# Patient Record
Sex: Female | Born: 1990 | Race: White | Hispanic: No | Marital: Single | Smoking: Never smoker
Health system: Southern US, Community
[De-identification: ages and names within clinical notes are randomized; demographics above are authoritative.]

## PROBLEM LIST (undated history)

## (undated) ENCOUNTER — Inpatient Hospital Stay (HOSPITAL_COMMUNITY): Payer: Self-pay

## (undated) DIAGNOSIS — L0291 Cutaneous abscess, unspecified: Secondary | ICD-10-CM

## (undated) DIAGNOSIS — D649 Anemia, unspecified: Secondary | ICD-10-CM

## (undated) DIAGNOSIS — T7840XA Allergy, unspecified, initial encounter: Secondary | ICD-10-CM

## (undated) DIAGNOSIS — F419 Anxiety disorder, unspecified: Secondary | ICD-10-CM

## (undated) DIAGNOSIS — I10 Essential (primary) hypertension: Secondary | ICD-10-CM

## (undated) DIAGNOSIS — F32A Depression, unspecified: Secondary | ICD-10-CM

## (undated) DIAGNOSIS — J45909 Unspecified asthma, uncomplicated: Secondary | ICD-10-CM

## (undated) DIAGNOSIS — F99 Mental disorder, not otherwise specified: Secondary | ICD-10-CM

## (undated) HISTORY — DX: Allergy, unspecified, initial encounter: T78.40XA

## (undated) HISTORY — DX: Depression, unspecified: F32.A

## (undated) HISTORY — DX: Anxiety disorder, unspecified: F41.9

## (undated) HISTORY — PX: CYST EXCISION: SHX5701

## (undated) HISTORY — DX: Essential (primary) hypertension: I10

## (undated) HISTORY — DX: Anemia, unspecified: D64.9

---

## 2010-08-24 ENCOUNTER — Emergency Department (HOSPITAL_COMMUNITY): Admission: EM | Admit: 2010-08-24 | Discharge: 2010-08-24 | Payer: Self-pay | Admitting: Emergency Medicine

## 2011-10-06 ENCOUNTER — Emergency Department (HOSPITAL_COMMUNITY): Payer: 59

## 2011-10-06 ENCOUNTER — Emergency Department (HOSPITAL_COMMUNITY)
Admission: EM | Admit: 2011-10-06 | Discharge: 2011-10-06 | Disposition: A | Discharge status: Home / Self Care | Payer: 59 | Attending: Emergency Medicine | Admitting: Emergency Medicine

## 2011-10-06 DIAGNOSIS — M545 Low back pain, unspecified: Secondary | ICD-10-CM | POA: Insufficient documentation

## 2011-10-06 DIAGNOSIS — M79609 Pain in unspecified limb: Secondary | ICD-10-CM | POA: Insufficient documentation

## 2011-10-06 DIAGNOSIS — S20219A Contusion of unspecified front wall of thorax, initial encounter: Secondary | ICD-10-CM | POA: Insufficient documentation

## 2011-10-06 DIAGNOSIS — R079 Chest pain, unspecified: Secondary | ICD-10-CM | POA: Insufficient documentation

## 2011-10-06 DIAGNOSIS — S335XXA Sprain of ligaments of lumbar spine, initial encounter: Secondary | ICD-10-CM | POA: Insufficient documentation

## 2012-03-30 ENCOUNTER — Emergency Department (HOSPITAL_COMMUNITY)
Admission: EM | Admit: 2012-03-30 | Discharge: 2012-03-30 | Disposition: A | Discharge status: Home / Self Care | Payer: Self-pay | Attending: Emergency Medicine | Admitting: Emergency Medicine

## 2012-03-30 ENCOUNTER — Encounter (HOSPITAL_COMMUNITY): Payer: Self-pay | Admitting: *Deleted

## 2012-03-30 ENCOUNTER — Emergency Department (HOSPITAL_COMMUNITY): Payer: Self-pay

## 2012-03-30 DIAGNOSIS — R079 Chest pain, unspecified: Secondary | ICD-10-CM | POA: Insufficient documentation

## 2012-03-30 DIAGNOSIS — R062 Wheezing: Secondary | ICD-10-CM | POA: Insufficient documentation

## 2012-03-30 DIAGNOSIS — J189 Pneumonia, unspecified organism: Secondary | ICD-10-CM | POA: Insufficient documentation

## 2012-03-30 MED ORDER — ALBUTEROL SULFATE HFA 108 (90 BASE) MCG/ACT IN AERS
2.0000 | INHALATION_SPRAY | RESPIRATORY_TRACT | Status: DC
  Administered 2012-03-30: 2 via RESPIRATORY_TRACT
  Filled 2012-03-30: qty 6.7

## 2012-03-30 MED ORDER — HYDROCOD POLST-CHLORPHEN POLST 10-8 MG/5ML PO LQCR: 5.0000 mL | Freq: Two times a day (BID) | ORAL | Status: DC

## 2012-03-30 MED ORDER — AZITHROMYCIN 250 MG PO TABS: 500.0000 mg | ORAL_TABLET | Freq: Once | ORAL | Status: AC

## 2012-03-30 NOTE — ED Notes (Signed)
Pt has returned from xray

## 2012-03-30 NOTE — ED Notes (Signed)
Patient transported to X-ray 

## 2012-03-30 NOTE — ED Notes (Signed)
Pt provided with Albuterol inhaler at discharge.

## 2012-03-30 NOTE — ED Notes (Signed)
Reports cough since Wednesday. Nonproductive. Fever 100-104. Is exposed to second hand smoke. States today she coughed really hard and felt something pop in left lower ribs. Now painful to take deep breath and to move . No crepitus appreciated. Tender to touch

## 2012-03-30 NOTE — Discharge Instructions (Signed)

## 2012-03-30 NOTE — ED Provider Notes (Signed)
History     CSN: [id number]  Arrival date & time 03/30/12  1552   First MD Initiated Contact with Patient 03/30/12 1726      Chief Complaint  Patient presents with  . Cough  . 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  Patient is a 20 y.o. female presenting with cough and chest pain. The history is provided by the patient. No language interpreter was used.  Cough This is a new problem. The current episode started more than 2 days ago. The problem occurs constantly. The problem has not changed since onset.The cough is productive of sputum. The maximum temperature recorded prior to her arrival was 101 to 101.9 F. The fever has been present for 3 to 4 days. Associated symptoms include chest pain and wheezing. Pertinent negatives include no chills, no sweats, no weight loss, no ear congestion, no ear pain, no headaches, no rhinorrhea, no sore throat, no myalgias, no shortness of breath and no eye redness. She has tried cough syrup and decongestants for the symptoms. The treatment provided no relief. She is not a smoker. Her past medical history does not include bronchitis, pneumonia, bronchiectasis, COPD, emphysema or asthma.  Chest Pain Primary symptoms include cough and wheezing. Pertinent negatives for primary symptoms include no shortness of breath.  The patient's medical history does not include asthma or COPD.     History reviewed. No pertinent past medical history.  History reviewed. No pertinent past  surgical history.  No family history on file.  History  Substance Use Topics  . Smoking status: Never Smoker   . Smokeless tobacco: Not on file  . Alcohol Use: Yes    OB History    Grav Para Term Preterm Abortions TAB SAB Ect Mult Living                  Review of Systems  Constitutional: Negative for chills and weight loss.  HENT: Negative for ear pain, sore throat and rhinorrhea.   Eyes: Negative for redness.  Respiratory: Positive for cough and wheezing. Negative for shortness of breath.   Cardiovascular: Positive for chest pain.  Musculoskeletal: Negative for myalgias.  Neurological: Negative for headaches.  All other systems reviewed and are negative.    Allergies  Tylenol  Home Medications   Current Outpatient Rx  Name Route Sig Dispense Refill  . CALCIUM PO Oral Take 1 tablet by mouth daily.    . GUAIFENESIN ER 600 MG PO TB12 Oral Take 1,200 mg by mouth 2 (two) times daily.    . IBUPROFEN 200 MG PO TABS Oral Take 400 mg by mouth every 6 (six) hours as needed. For fever      BP 106/71  Pulse 98  Temp(Src) 99.6 F (37.6 C) (Oral)  Resp 20  Ht 5\' 4"  (1.626 m)  Wt 166 lb (75.297 kg)  BMI 28.49 kg/m2  SpO2 97%  LMP 03/17/2012  Physical Exam  Nursing note and vitals reviewed. Constitutional: She is oriented to person, place, and time. She appears well-developed and well-nourished. No distress.  HENT:  Head: Normocephalic and atraumatic.  Right Ear: External ear normal.  Left Ear: External ear normal.  Nose: Nose normal.  Mouth/Throat: Oropharynx is clear and moist. No oropharyngeal exudate.  Eyes: Conjunctivae are normal. Pupils are equal, round, and reactive to light. No scleral icterus.  Neck: Normal range of motion. Neck supple.  Cardiovascular: Normal rate, regular rhythm and normal heart sounds.  Exam reveals no gallop and no friction rub.   No murmur heard. Pulmonary/Chest: Effort normal and breath sounds normal. No respiratory distress. She has  no wheezes. She has no rales. She exhibits tenderness.    Abdominal: Soft. Bowel sounds are normal. She exhibits no distension and no mass. There is no tenderness. There is no rebound and no guarding.  Musculoskeletal: Normal range of motion. She exhibits no edema and no tenderness.  Lymphadenopathy:    She has no cervical adenopathy.  Neurological: She is alert and oriented to person, place, and time. No cranial nerve deficit.  Skin: Skin is warm and dry. No rash noted. No erythema. No pallor.  Psychiatric: She has a normal mood and affect. Her behavior is normal. Judgment and thought content normal.    ED Course  Procedures (including critical care time)  Labs Reviewed - No data to display Dg Ribs Unilateral W/chest Left  03/30/2012  *RADIOLOGY REPORT*  Clinical Data: Left-sided chest pain and cough for 1 week.  LEFT RIBS AND CHEST - 3+ VIEW  Comparison: Chest radiograph 10/06/2011  Findings: The chest radiograph demonstrates new patchy densities at the right lung base.  These findings are concerning for airspace disease and pneumonia.  No evidence for a pneumothorax. Heart and mediastinum are within normal limits.  A BB was placed over the left lower ribs.  No evidence for a displaced left rib fracture.  IMPRESSION: Patchy densities at the right lung base are suggestive for pneumonia.  No evidence for a left rib fracture.  Original Report Authenticated By: Richarda Overlie, M.D.     CAP    MDM  Patient with CAP without hypoxia.  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        Izola Price [place], [place] 03/30/12 [phone]

## 2012-03-30 NOTE — ED Notes (Signed)
Patient complains of pain in her left side.  She has had a cough for 1 week.  She states her pain has increased today.  She has increased pain when coughing and breathing.

## 2012-03-31 NOTE — ED Provider Notes (Signed)
Medical screening examination/treatment/procedure(s) were performed by non-physician practitioner and as supervising physician I was immediately available for consultation/collaboration. Devoria Albe, MD, Armando Gang   Ward Givens, MD 03/31/12 Jacinta Shoe

## 2013-07-09 ENCOUNTER — Emergency Department (HOSPITAL_COMMUNITY)
Admission: EM | Admit: 2013-07-09 | Discharge: 2013-07-09 | Disposition: A | Discharge status: Home / Self Care | Payer: Self-pay | Attending: Emergency Medicine | Admitting: Emergency Medicine

## 2013-07-09 ENCOUNTER — Encounter (HOSPITAL_COMMUNITY): Payer: Self-pay | Admitting: *Deleted

## 2013-07-09 DIAGNOSIS — R42 Dizziness and giddiness: Secondary | ICD-10-CM | POA: Insufficient documentation

## 2013-07-09 DIAGNOSIS — L02419 Cutaneous abscess of limb, unspecified: Secondary | ICD-10-CM | POA: Insufficient documentation

## 2013-07-09 DIAGNOSIS — R110 Nausea: Secondary | ICD-10-CM | POA: Insufficient documentation

## 2013-07-09 DIAGNOSIS — L02415 Cutaneous abscess of right lower limb: Secondary | ICD-10-CM

## 2013-07-09 DIAGNOSIS — R509 Fever, unspecified: Secondary | ICD-10-CM | POA: Insufficient documentation

## 2013-07-09 DIAGNOSIS — R51 Headache: Secondary | ICD-10-CM | POA: Insufficient documentation

## 2013-07-09 DIAGNOSIS — R000 Tachycardia, unspecified: Secondary | ICD-10-CM | POA: Insufficient documentation

## 2013-07-09 MED ORDER — SULFAMETHOXAZOLE-TRIMETHOPRIM 800-160 MG PO TABS: 1.0000 | ORAL_TABLET | Freq: Two times a day (BID) | ORAL | Status: DC

## 2013-07-09 MED ORDER — LIDOCAINE HCL (PF) 2 % IJ SOLN
10.0000 mL | Freq: Once | INTRAMUSCULAR | Status: AC
  Administered 2013-07-09: 10 mL
  Filled 2013-07-09: qty 10

## 2013-07-09 MED ORDER — IBUPROFEN 600 MG PO TABS: 600.0000 mg | ORAL_TABLET | Freq: Four times a day (QID) | ORAL | Status: DC | PRN

## 2013-07-09 MED ORDER — SULFAMETHOXAZOLE-TMP DS 800-160 MG PO TABS
2.0000 | ORAL_TABLET | Freq: Once | ORAL | Status: AC
  Administered 2013-07-09: 2 via ORAL
  Filled 2013-07-09: qty 2

## 2013-07-09 MED ORDER — IBUPROFEN 800 MG PO TABS
800.0000 mg | ORAL_TABLET | Freq: Once | ORAL | Status: AC
  Administered 2013-07-09: 800 mg via ORAL
  Filled 2013-07-09: qty 1

## 2013-07-09 MED ORDER — ONDANSETRON 4 MG PO TBDP
4.0000 mg | ORAL_TABLET | Freq: Once | ORAL | Status: AC
  Administered 2013-07-09: 4 mg via ORAL
  Filled 2013-07-09: qty 1

## 2013-07-09 NOTE — ED Notes (Signed)
Pt has an abcess on the inner right thigh. Pt c/o nausea, body aches,  and lightheadedness. Pt also c/o headache.

## 2013-07-09 NOTE — ED Notes (Signed)
Pt c/o abscess to right inguinal area. States it is "hard and deep, about the size of a dollar coin". Reports if unsure if there is drainage from the site as there was a small wet spot in her pants but the abscess does not appear to have ruptured.

## 2013-07-09 NOTE — ED Provider Notes (Signed)
History    CSN: [id number] Arrival date & time 07/09/13  2040  First MD Initiated Contact with Patient 07/09/13 2226     Chief Complaint  Patient presents with  . Recurrent Skin Infections   (Consider location/radiation/quality/duration/timing/severity/associated sxs/prior Treatment) Patient is a 22 y.o. female presenting with abscess. The history is provided by the patient.  Abscess Location:  Leg Leg abscess location:  R upper leg Abscess quality: induration, painful and redness   Red streaking: no   Duration:  1 week Progression:  Worsening Pain details:    Quality:  Throbbing and shooting   Duration:  4 days   Timing:  Constant Relieved by:  Nothing Worsened by:  Nothing tried Ineffective treatments:  None tried Associated symptoms: fever, headaches (with fever) and nausea   Associated symptoms: no vomiting    Becky Fuller is a 22 y.o. female who presents to the ED with an abscess to the inner aspect of the right thigh. The area has been there for a week. It has continued to get worse and today the pain is really bad. Thinks the area may have started draining just as she arrived to the ED. She has had cyst drained in the past.   History reviewed. No pertinent past medical history. History reviewed. No pertinent past surgical history. History reviewed. No pertinent family history. History  Substance Use Topics  . Smoking status: Never Smoker   . Smokeless tobacco: Not on file  . Alcohol Use: Yes     Comment: occasionally   OB History   Grav Para Term Preterm Abortions TAB SAB Ect Mult Living                 Review of Systems  Constitutional: Positive for fever.  HENT: Negative for neck stiffness.   Gastrointestinal: Positive for nausea. Negative for vomiting and abdominal pain.  Musculoskeletal:       Pain and swelling inner aspect of right thigh.  Skin:       abscess  Neurological: Positive for light-headedness and headaches (with fever).    Psychiatric/Behavioral: The patient is not nervous/anxious.     Allergies  Tylenol  Home Medications   Current Outpatient Rx  Name  Route  Sig  Dispense  Refill  . desogestrel-ethinyl estradiol (CYCLESSA) 0.1/0.125/0.15 -0.025 MG tablet   Oral   Take 1 tablet by mouth daily.          BP 102/56  Pulse 110  Temp(Src) 102.5 F (39.2 C) (Oral)  Resp 20  Ht 5\' 4"  (1.626 m)  Wt 170 lb (77.111 kg)  BMI 29.17 kg/m2  SpO2 99%  LMP 06/21/2013 Physical Exam  Nursing note and vitals reviewed. Constitutional: She is oriented to person, place, and time. She appears well-developed and well-nourished. No distress.  HENT:  Head: Normocephalic.  Eyes: EOM are normal.  Neck: Normal range of motion. Neck supple.  Cardiovascular: Tachycardia present.   Pulmonary/Chest: Effort normal.  Musculoskeletal: Normal range of motion.       Right upper leg: She exhibits tenderness and swelling.       Legs: Abscess noted inner aspect of right thigh. Tender with exam. No drainage noted at this time.  Neurological: She is alert and oriented to person, place, and time. No cranial nerve deficit.  Skin: Skin is warm and dry.  Psychiatric: She has a normal mood and affect. Her behavior is normal.    ED Course  Procedures INCISION AND DRAINAGE Performed by: Trevel Dillenbeck Consent: Verbal  consent obtained. Risks and benefits: risks, benefits and alternatives were discussed Type: abscess  Body area: inner aspect of right thigh  Anesthesia: local infiltration  Local anesthetic: lidocaine 2% without epinephrine  Anesthetic total: 2 ml  Straight incision make with # 11 blade  Complexity: complex  Blunt dissection to break up loculations  Drainage: purulent  Drainage amount: small  Irrigated with NSS  Packing material: 1/4 in iodoform gauze  Patient tolerance: Patient tolerated the procedure well with no immediate complications.   MDM  22 y.o. female with abscess to right upper leg.  Fever 102.8 will treat fever and start antibiotics. Discussed with the patient findings and plan of care and all questioned fully answered. She will return if any problems arise.    Medication List    TAKE these medications       ibuprofen 600 MG tablet  Commonly known as:  ADVIL,MOTRIN  Take 1 tablet (600 mg total) by mouth every 6 (six) hours as needed for pain.     sulfamethoxazole-trimethoprim 800-160 MG per tablet  Commonly known as:  SEPTRA DS  Take 1 tablet by mouth every 12 (twelve) hours.      ASK your doctor about these medications       CYCLESSA 0.1/0.125/0.15 -0.025 MG tablet  Generic drug:  desogestrel-ethinyl estradiol  Take 1 tablet by mouth daily.         [place], [place] 07/09/13 2337

## 2013-07-09 NOTE — ED Notes (Signed)
Prior to departure, pt tolerating peanut butter crackers and fluids.

## 2013-07-14 NOTE — ED Provider Notes (Signed)
Medical screening examination/treatment/procedure(s) were performed by non-physician practitioner and as supervising physician I was immediately available for consultation/collaboration.  Raeford Razor, MD 07/14/13 2235

## 2013-08-16 IMAGING — CR DG RIBS W/ CHEST 3+V*L*
3 series · 3 of 3 positions shown · non-contrast
Comparison: Chest radiograph 10/06/2011

CLINICAL DATA: Left-sided chest pain and cough for 1 week.

LEFT RIBS AND CHEST - 3+ VIEW

[w chest pa]
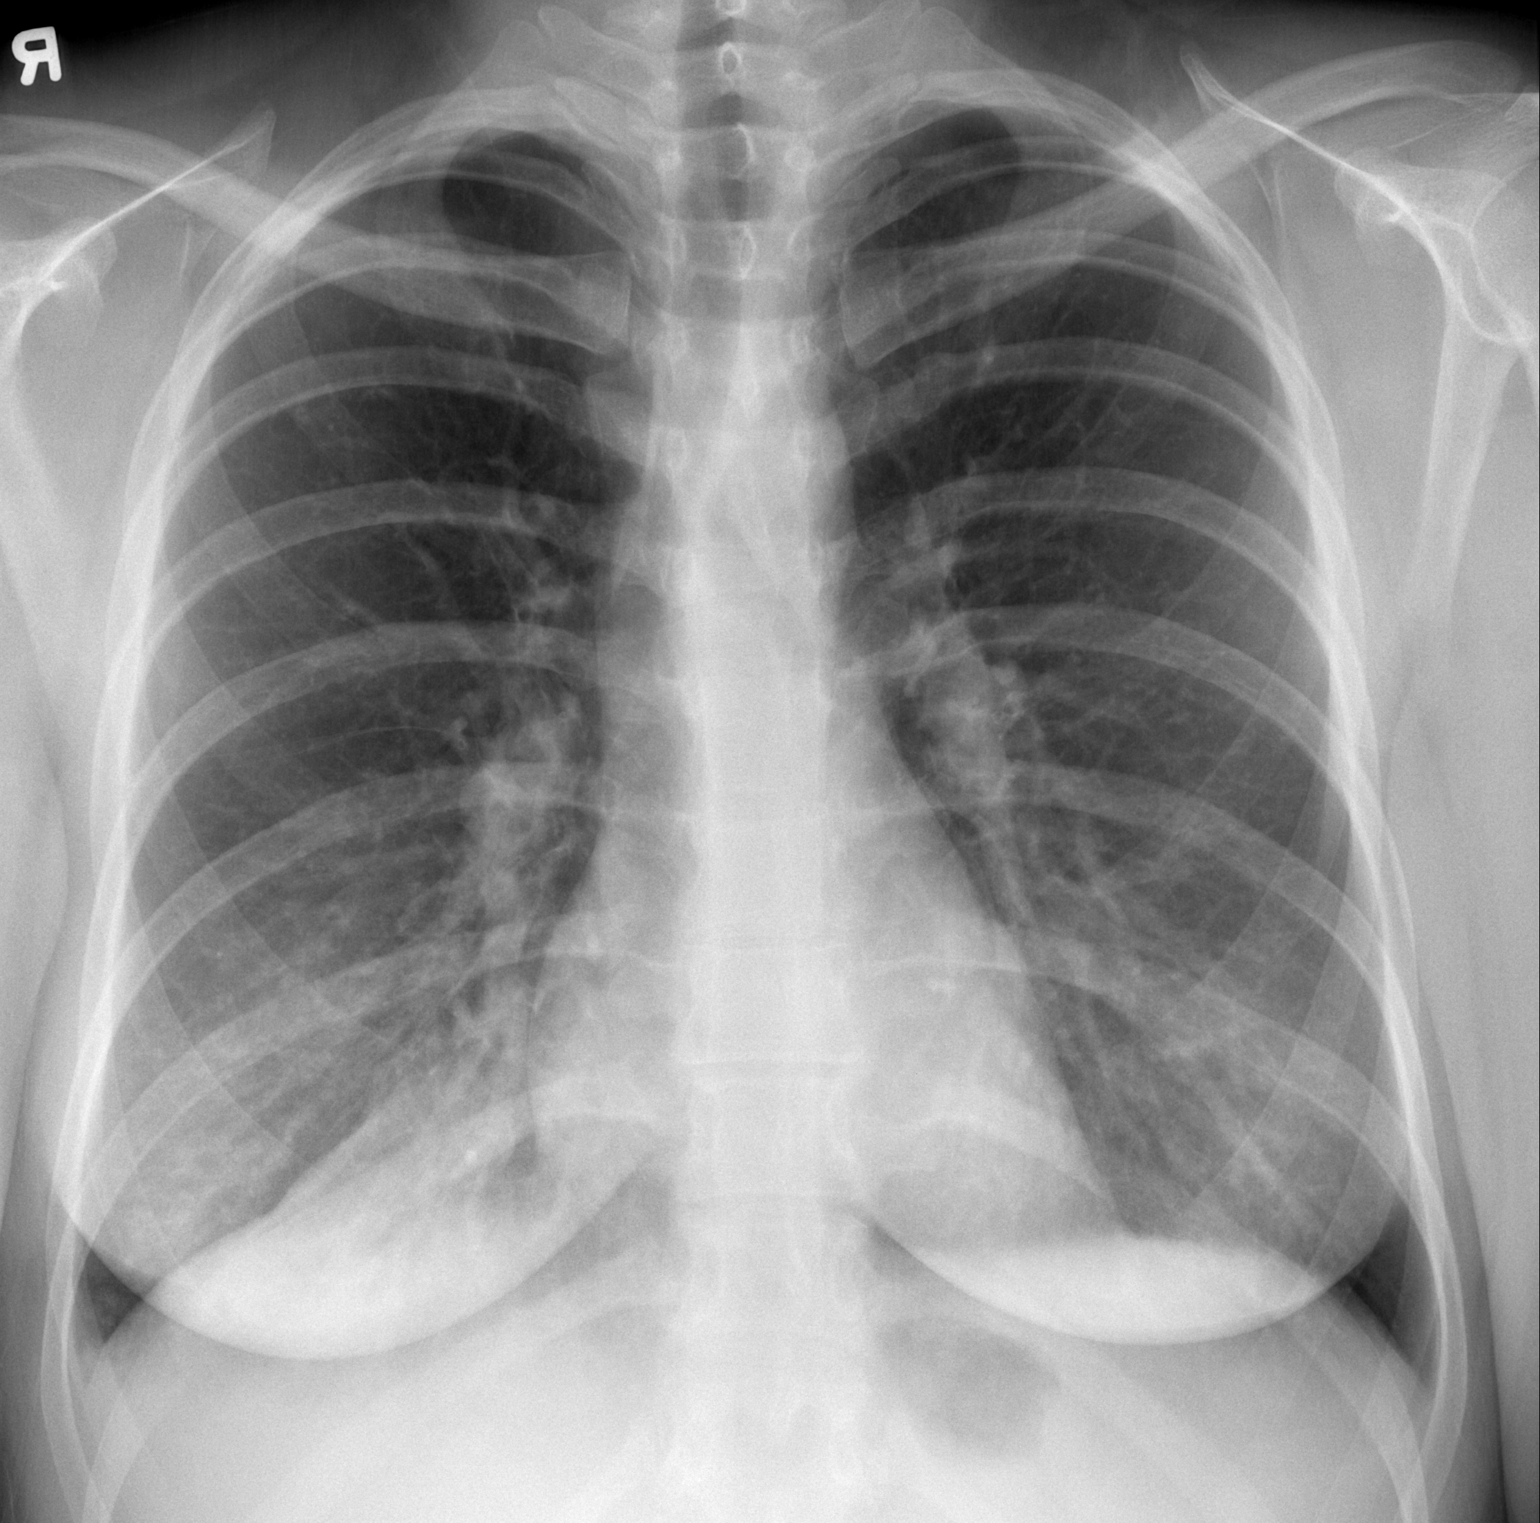

[w ribs ap/pa upper left]
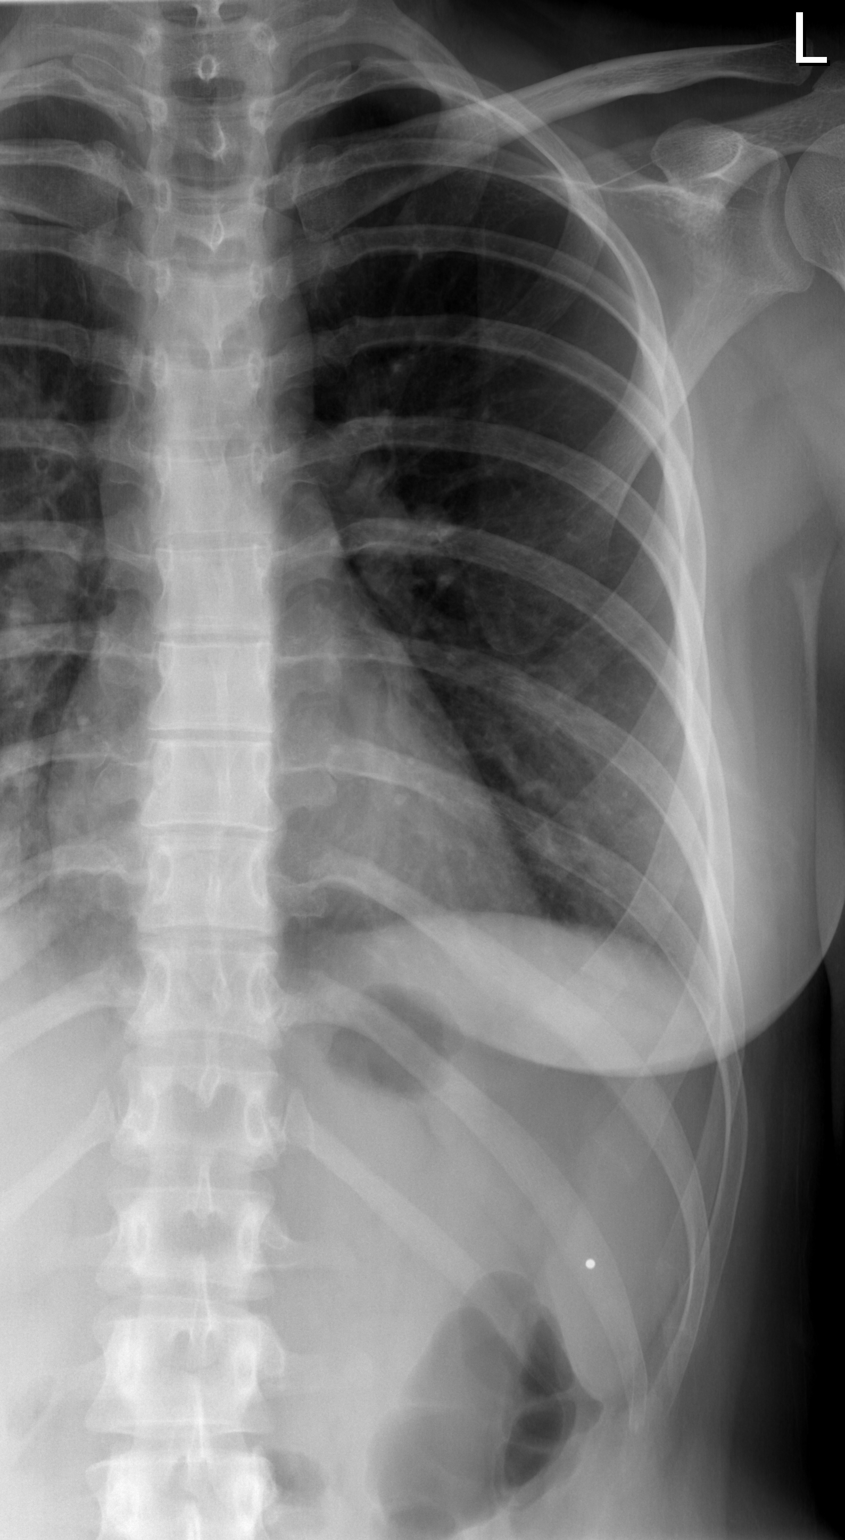

[w ribs oblique left]
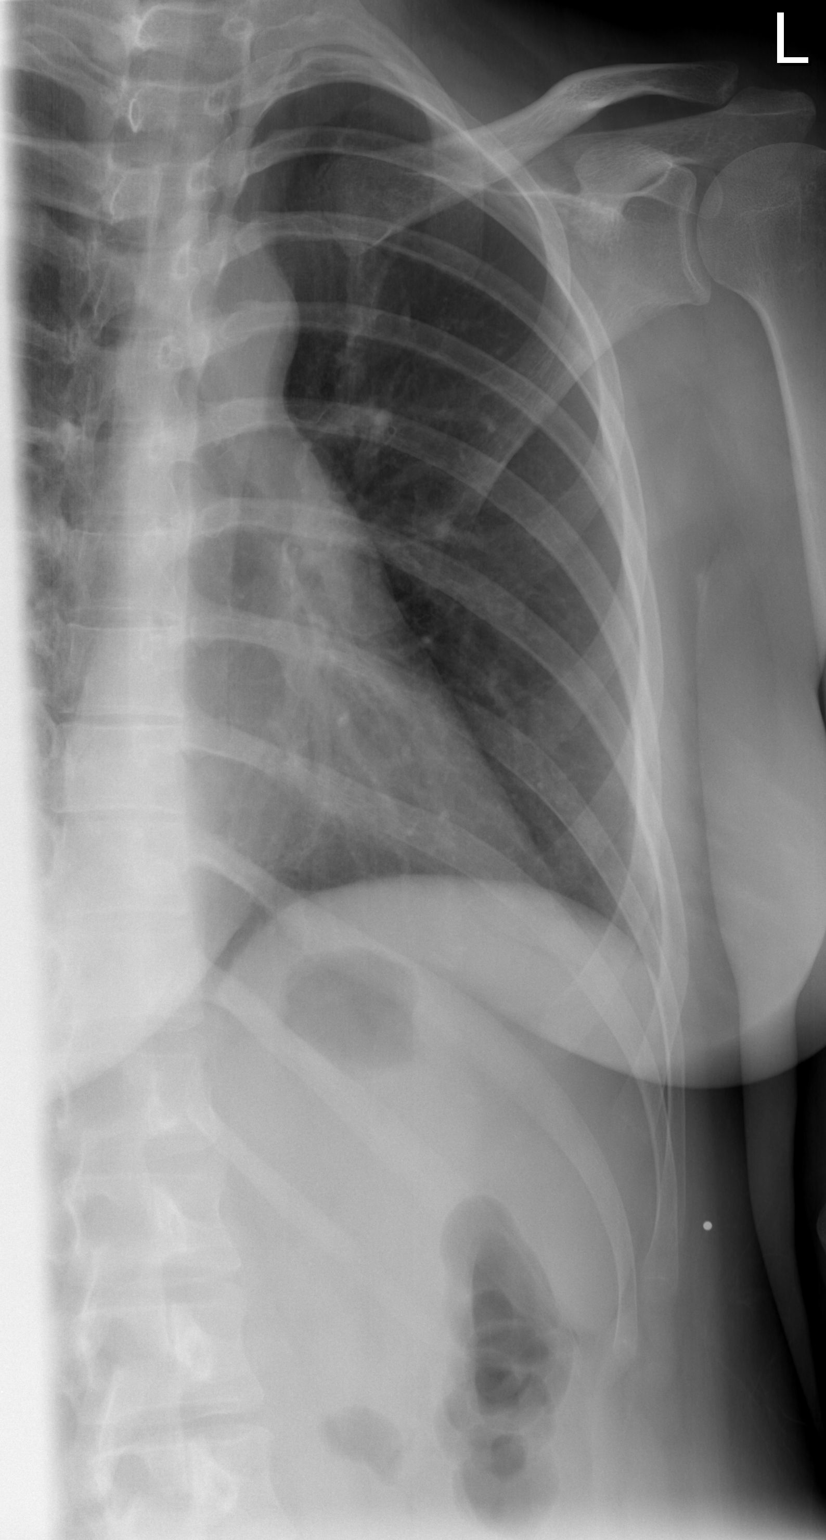

[3 of 3 positions shown; findings below may reference images not displayed]

FINDINGS: The chest radiograph demonstrates new patchy densities at
the right lung base.  These findings are concerning for airspace
disease and pneumonia.  No evidence for a pneumothorax. Heart and
mediastinum are within normal limits.  A BB was placed over the
left lower ribs.  No evidence for a displaced left rib fracture.
IMPRESSION: Patchy densities at the right lung base are suggestive for
pneumonia.

No evidence for a left rib fracture.

## 2015-09-22 ENCOUNTER — Encounter (HOSPITAL_COMMUNITY): Payer: Self-pay | Admitting: Emergency Medicine

## 2015-09-22 ENCOUNTER — Emergency Department (HOSPITAL_COMMUNITY)
Admission: EM | Admit: 2015-09-22 | Discharge: 2015-09-22 | Disposition: A | Discharge status: Home / Self Care | Payer: Self-pay | Attending: Emergency Medicine | Admitting: Emergency Medicine

## 2015-09-22 DIAGNOSIS — J45909 Unspecified asthma, uncomplicated: Secondary | ICD-10-CM | POA: Insufficient documentation

## 2015-09-22 DIAGNOSIS — Z79899 Other long term (current) drug therapy: Secondary | ICD-10-CM | POA: Insufficient documentation

## 2015-09-22 DIAGNOSIS — L02411 Cutaneous abscess of right axilla: Secondary | ICD-10-CM | POA: Insufficient documentation

## 2015-09-22 HISTORY — DX: Cutaneous abscess, unspecified: L02.91

## 2015-09-22 HISTORY — DX: Unspecified asthma, uncomplicated: J45.909

## 2015-09-22 MED ORDER — SULFAMETHOXAZOLE-TRIMETHOPRIM 800-160 MG PO TABS: 1.0000 | ORAL_TABLET | Freq: Two times a day (BID) | ORAL | Status: AC

## 2015-09-22 MED ORDER — LIDOCAINE-EPINEPHRINE (PF) 2 %-1:200000 IJ SOLN
10.0000 mL | Freq: Once | INTRAMUSCULAR | Status: AC
  Administered 2015-09-22: 10 mL via INTRADERMAL
  Filled 2015-09-22: qty 20

## 2015-09-22 NOTE — ED Provider Notes (Signed)
CSN: [id number]     Arrival date & time 09/22/15  1527 History   First MD Initiated Contact with Patient 09/22/15 1544     Chief Complaint  Patient presents with  . Abscess     (Consider location/radiation/quality/duration/timing/severity/associated sxs/prior Treatment) Patient is a 24 y.o. female presenting with abscess.  Abscess Associated symptoms: no fever and no headaches    Becky Fuller is a 24 y.o. female presents to emergency department complaining of an abscess to right axilla. Patient states started about a week ago, gradually, larger and more painful. She reports multiple prior abscesses. She denies any fever or chills. No generalized malaise. Pain worsened with palpation and arm movement. Denies any other complaints at this time. She does want to be tested for MRSA. She also states she is concerned she may have lymphoma so she gets recurrent abscesses. Explained to her this is unrelated. Patient went to [facility] clinic today and was sent here.  Past Medical History  Diagnosis Date  . Abscess   . Asthma    History reviewed. No pertinent past surgical history. No family history on file. Social History  Substance Use Topics  . Smoking status: Never Smoker   . Smokeless tobacco: None  . Alcohol Use: Yes     Comment: occasionally   OB History    No data available     Review of Systems  Constitutional: Negative for fever and chills.  Respiratory: Negative for cough, chest tightness and shortness of breath.   Cardiovascular: Negative for chest pain, palpitations and leg swelling.  Musculoskeletal: Negative for myalgias, arthralgias, neck pain and neck stiffness.  Skin: Positive for wound.  Neurological: Negative for dizziness, weakness and headaches.  All other systems reviewed and are negative.     Allergies  Tylenol  Home Medications   Prior to Admission medications   Medication Sig Start Date End Date Taking? Authorizing Provider   desogestrel-ethinyl estradiol (CYCLESSA) 0.1/0.125/0.15 -0.025 MG tablet Take 1 tablet by mouth daily.    Historical Provider, MD  ibuprofen (ADVIL,MOTRIN) 600 MG tablet Take 1 tablet (600 mg total) by mouth every 6 (six) hours as needed for pain. 07/09/13   Hope Orlene Och, NP  sulfamethoxazole-trimethoprim (SEPTRA DS) 800-160 MG per tablet Take 1 tablet by mouth every 12 (twelve) hours. 07/09/13   Hope Orlene Och, NP   BP 121/83 mmHg  Pulse 84  Temp(Src) 98.8 F (37.1 C) (Oral)  Resp 20  Ht  (1.626 m)  Wt 145 lb (65.772 kg)  BMI 24.88 kg/m2  SpO2 98%  LMP 09/09/2015 Physical Exam  Constitutional: She appears well-developed and well-nourished. No distress.  HENT:  Head: Normocephalic.  Eyes: Conjunctivae are normal.  Neck: Neck supple.  Cardiovascular: Normal rate, regular rhythm and normal heart sounds.   Pulmonary/Chest: Effort normal and breath sounds normal. No respiratory distress. She has no wheezes. She has no rales.  Musculoskeletal: She exhibits no edema.  Neurological: She is alert.  Skin: Skin is warm and dry.  3 x 3 cm area of induration, erythema, swelling, consistent with an abscess to the right axilla. No drainage. Mild surrounding cellulitis extending down the upper medial arm.  Psychiatric: She has a normal mood and affect. Her behavior is normal.  Nursing note and vitals reviewed.   ED Course  Procedures (including critical care time) Labs Review Labs Reviewed  WOUND CULTURE    Imaging Review No results found. I have personally reviewed and evaluated these images and lab results  as part of my medical decision-making.   EKG Interpretation None     INCISION AND DRAINAGE Performed by: Jaynie Crumble A Consent: Verbal consent obtained. Risks and benefits: risks, benefits and alternatives were discussed Type: abscess  Body area: right axilla  Anesthesia: local infiltration  Incision was made with a scalpel.  Local anesthetic: lidocaine 2% w  epinephrine  Anesthetic total: 3 ml  Complexity: complex Blunt dissection to break up loculations  Drainage: purulent  Drainage amount: large  Packing material: 1/4 in iodoform gauze  Patient tolerance: Patient tolerated the procedure well with no immediate complications.    MDM   Final diagnoses:  Abscess of right axilla   Patient with an abscess to the right axilla. Incised and drained. Will start on antibiotics given mild cellulitic changes around the abscess. Advised to do warm compresses at home. Follow-up as needed. Patient is to pull packing out in 2 days if improving, otherwise she is to return for recheck.  Filed Vitals:   09/22/15 1535 09/22/15 1538  BP: 121/83   Pulse: 84   Temp: 98.8 F (37.1 C)   TempSrc: Oral   Resp: 20   Height:   (1.626 m)  Weight:  145 lb (65.772 kg)  SpO2: 98%      Jaynie Crumble, PA-C 09/22/15 2145  Melene Plan, DO 09/23/15 [phone]

## 2015-09-22 NOTE — Discharge Instructions (Signed)
Apply warm compresses several times a day. Keep the wound clean and dry. Take antibiotics as prescribed until all gone. Follow-up as needed. Remove packing in 2 days.   Abscess Care After An abscess (also called a boil or furuncle) is an infected area that contains a collection of pus. Signs and symptoms of an abscess include pain, tenderness, redness, or hardness, or you may feel a moveable soft area under your skin. An abscess can occur anywhere in the body. The infection may spread to surrounding tissues causing cellulitis. A cut (incision) by the surgeon was made over your abscess and the pus was drained out. Gauze may have been packed into the space to provide a drain that will allow the cavity to heal from the inside outwards. The boil may be painful for 5 to 7 days. Most people with a boil do not have high fevers. Your abscess, if seen early, may not have localized, and may not have been lanced. If not, another appointment may be required for this if it does not get better on its own or with medications. HOME CARE INSTRUCTIONS   Only take over-the-counter or prescription medicines for pain, discomfort, or fever as directed by your caregiver.  When you bathe, soak and then remove gauze or iodoform packs at least daily or as directed by your caregiver. You may then wash the wound gently with mild soapy water. Repack with gauze or do as your caregiver directs. SEEK IMMEDIATE MEDICAL CARE IF:   You develop increased pain, swelling, redness, drainage, or bleeding in the wound site.  You develop signs of generalized infection including muscle aches, chills, fever, or a general ill feeling.  An oral temperature above 102 F (38.9 C) develops, not controlled by medication. See your caregiver for a recheck if you develop any of the symptoms described above. If medications (antibiotics) were prescribed, take them as directed. Document Released: 06/26/2005 Document Revised: 03/01/2012 Document  Reviewed: 02/21/2008 [facility] Patient Information 2015 [place], [place]. This information is not intended to replace advice given to you by your health care provider. Make sure you discuss any questions you have with your health care provider.

## 2015-09-22 NOTE — ED Notes (Signed)
Pt c/o abscess to right axilla area x 1 week.

## 2015-09-25 ENCOUNTER — Telehealth (HOSPITAL_COMMUNITY): Payer: Self-pay

## 2015-09-25 LAB — WOUND CULTURE: Special Requests: NORMAL

## 2015-09-25 NOTE — Telephone Encounter (Signed)
Lab called positive wound culture- MRSA. Treated per protocol. Attempting to contact pt.

## 2015-09-26 ENCOUNTER — Telehealth (HOSPITAL_BASED_OUTPATIENT_CLINIC_OR_DEPARTMENT_OTHER): Payer: Self-pay | Admitting: Emergency Medicine

## 2015-09-26 NOTE — Telephone Encounter (Signed)
Post ED Visit - Positive Culture Follow-up  Culture report reviewed by antimicrobial stewardship pharmacist:   Celedonio Miyamoto, Pharm.D., BCPS  Georgina Pillion, Pharm.D., BCPS  [place], [address].D., BCPS, AAHIVP  Estella Husk, Pharm.D., BCPS, AAHIVP  Cristy Reyes, [address].D.  Tennis Must, Pharm.D. Sandi Carne PharmD  Positive wound culture MRSA Treated with bactrim DS, organism sensitive to the same and no further patient follow-up is required at this time.  Berle Mull 09/26/2015, 1:50 PM

## 2015-09-27 ENCOUNTER — Telehealth (HOSPITAL_BASED_OUTPATIENT_CLINIC_OR_DEPARTMENT_OTHER): Payer: Self-pay | Admitting: Emergency Medicine

## 2015-10-27 ENCOUNTER — Telehealth (HOSPITAL_COMMUNITY): Payer: Self-pay

## 2015-10-27 NOTE — Telephone Encounter (Signed)
Unable to contact pt by mail or telephone. Unable to communicate lab results or treatment changes. 

## 2018-02-10 LAB — HM PAP SMEAR

## 2018-03-10 LAB — OB RESULTS CONSOLE GC/CHLAMYDIA
Chlamydia: NEGATIVE
GC PROBE AMP, GENITAL: NEGATIVE

## 2018-03-10 LAB — OB RESULTS CONSOLE HIV ANTIBODY (ROUTINE TESTING): HIV: NONREACTIVE

## 2018-03-10 LAB — OB RESULTS CONSOLE HEPATITIS B SURFACE ANTIGEN: Hepatitis B Surface Ag: NEGATIVE

## 2018-03-10 LAB — OB RESULTS CONSOLE RPR: RPR: NONREACTIVE

## 2018-03-10 LAB — OB RESULTS CONSOLE RUBELLA ANTIBODY, IGM: Rubella: IMMUNE

## 2018-03-24 ENCOUNTER — Encounter (HOSPITAL_COMMUNITY): Payer: Self-pay | Admitting: *Deleted

## 2018-03-24 ENCOUNTER — Other Ambulatory Visit: Payer: Self-pay

## 2018-03-24 ENCOUNTER — Inpatient Hospital Stay (HOSPITAL_COMMUNITY)
Admission: AD | Admit: 2018-03-24 | Discharge: 2018-03-24 | Disposition: A | Discharge status: Home / Self Care | Payer: BC Managed Care – PPO | Source: Ambulatory Visit | Attending: Obstetrics | Admitting: Obstetrics

## 2018-03-24 DIAGNOSIS — O1202 Gestational edema, second trimester: Secondary | ICD-10-CM | POA: Insufficient documentation

## 2018-03-24 DIAGNOSIS — Z3A16 16 weeks gestation of pregnancy: Secondary | ICD-10-CM | POA: Diagnosis not present

## 2018-03-24 LAB — URINALYSIS, ROUTINE W REFLEX MICROSCOPIC
Bilirubin Urine: NEGATIVE
Glucose, UA: NEGATIVE mg/dL
Hgb urine dipstick: NEGATIVE
KETONES UR: 20 mg/dL — AB
LEUKOCYTES UA: NEGATIVE
NITRITE: NEGATIVE
PH: 6 (ref 5.0–8.0)
PROTEIN: NEGATIVE mg/dL
Specific Gravity, Urine: 1.01 (ref 1.005–1.030)

## 2018-03-24 NOTE — MAU Provider Note (Signed)
History     CSN: [id number]  Arrival date and time: 03/24/18 1234   First Provider Initiated Contact with Patient 03/24/18 1326      Chief Complaint  Patient presents with  . Foot Swelling  . hands swollen   HPI   Ms Hubbard Hartshornshley D Backhaus is a 27 y.o. female G1P0 @ 4348w5d here in MAU with complaints of swelling in both feet that she noticed today. She called the office first and they told her to come in. The office was unable to see her. She noticed some blotching and increased swelling in both lower legs. 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 No pain, symptoms are bilateral.   OB History    Gravida  1   Para      Term      Preterm      AB      Living        SAB      TAB      Ectopic      Multiple      Live Births              Past Medical History:  Diagnosis Date  . Abscess   . Asthma     History reviewed. No pertinent surgical history.  No family history on file.  Social History   Tobacco Use  . Smoking status: Never Smoker  . Smokeless tobacco: Never Used  Substance Use Topics  . Alcohol use: Not Currently    Comment: occasionally  . Drug use: Not Currently    Allergies:  Allergies  Allergen Reactions  . Tylenol [Acetaminophen] Anaphylaxis    Medications Prior to Admission  Medication Sig Dispense Refill Last Dose  . loratadine (CLARITIN) 10 MG tablet Take 10 mg by mouth daily.     Marland Kitchen. desogestrel-ethinyl estradiol (CYCLESSA) 0.1/0.125/0.15 -0.025 MG tablet Take 1 tablet by mouth daily.   More than a month at Unknown time  . ibuprofen (ADVIL,MOTRIN) 600 MG tablet Take 1 tablet (600 mg total) by mouth every 6 (six) hours as needed for pain. 30 tablet 0 More than a month at Unknown time  . sulfamethoxazole-trimethoprim (SEPTRA DS) 800-160 MG per tablet Take 1 tablet by mouth every 12 (twelve) hours. 14 tablet 0    Results for orders placed or performed during the hospital encounter of  03/24/18 (from the past 48 hour(s))  Urinalysis, Routine w reflex microscopic     Status: Abnormal   Collection Time: 03/24/18 12:53 PM  Result Value Ref Range   Color, Urine YELLOW YELLOW   APPearance CLEAR CLEAR   Specific Gravity, Urine 1.010 1.005 - 1.030   pH 6.0 5.0 - 8.0   Glucose, UA NEGATIVE NEGATIVE mg/dL   Hgb urine dipstick NEGATIVE NEGATIVE   Bilirubin Urine NEGATIVE NEGATIVE   Ketones, ur 20 (A) NEGATIVE mg/dL   Protein, ur NEGATIVE NEGATIVE mg/dL   Nitrite NEGATIVE NEGATIVE   Leukocytes, UA NEGATIVE NEGATIVE    Comment: Performed at [facility], [address] [place], [place] [address]   Review of Systems  Constitutional: Negative for fever.  Musculoskeletal: Positive for joint swelling.   Physical Exam   Blood pressure 120/72, pulse 92, temperature 98.6 F (37 C), temperature source Oral, resp. rate 18, height 5\' 4"  (1.626 m), weight 183 lb 12 oz (83.3 kg), SpO2 100 %.  Physical Exam  Constitutional: She is oriented to person, place, and  time. She appears well-developed and well-nourished. No distress.  HENT:  Head: Normocephalic.  Eyes: Pupils are equal, round, and reactive to light.  Cardiovascular: Normal rate.  Respiratory: Effort normal and breath sounds normal.  Musculoskeletal: Normal range of motion. She exhibits edema (+1 bilateral lower extremity edema, equal and no skin changes. ). She exhibits no tenderness.  Neurological: She is alert and oriented to person, place, and time. GCS eye subscore is 4. GCS verbal subscore is 5. GCS motor subscore is 6.  Skin: Skin is warm. She is not diaphoretic.    MAU Course  Procedures  None  MDM  UA Discussed patient with Dr. Billy Coast, ok for DC home + fetal heart tones via doppler   Assessment and Plan   A:  1. Edema of lower extremity in second trimester, antepartum   2. [redacted] weeks gestation of pregnancy     P:  Discharge home in stable condition Discussed ted hoes Alternate elevation and  mobility Increase oral hydration Follow up in the office  Cynitha Berte, Harolyn Rutherford, NP 03/24/2018 7:22 PM

## 2018-03-24 NOTE — MAU Note (Signed)
Was at work,noticed her feet were getting really red; became kind of tingly.  Went to nurse at work.  Was told her feet and lower legs were swollen, might be related to BP elevation- but they did not check her BP.  Had a weird feeling in her lower abd when she woke up this morning, has not had it since.  Hands are also a little swollen.

## 2018-03-24 NOTE — Discharge Instructions (Signed)

## 2018-07-30 LAB — OB RESULTS CONSOLE GBS: GBS: NEGATIVE

## 2018-08-11 ENCOUNTER — Encounter (HOSPITAL_COMMUNITY): Payer: Self-pay | Admitting: *Deleted

## 2018-08-11 ENCOUNTER — Inpatient Hospital Stay (HOSPITAL_COMMUNITY)
Admission: AD | Admit: 2018-08-11 | Discharge: 2018-08-11 | Disposition: A | Discharge status: Home / Self Care | Payer: BC Managed Care – PPO | Source: Ambulatory Visit | Attending: Obstetrics & Gynecology | Admitting: Obstetrics & Gynecology

## 2018-08-11 DIAGNOSIS — R030 Elevated blood-pressure reading, without diagnosis of hypertension: Secondary | ICD-10-CM | POA: Diagnosis present

## 2018-08-11 DIAGNOSIS — Z3A36 36 weeks gestation of pregnancy: Secondary | ICD-10-CM

## 2018-08-11 DIAGNOSIS — O133 Gestational [pregnancy-induced] hypertension without significant proteinuria, third trimester: Secondary | ICD-10-CM

## 2018-08-11 LAB — URINALYSIS, ROUTINE W REFLEX MICROSCOPIC
Bacteria, UA: NONE SEEN
Bilirubin Urine: NEGATIVE
GLUCOSE, UA: NEGATIVE mg/dL
HGB URINE DIPSTICK: NEGATIVE
Ketones, ur: 20 mg/dL — AB
NITRITE: NEGATIVE
Protein, ur: NEGATIVE mg/dL
SPECIFIC GRAVITY, URINE: 1.016 (ref 1.005–1.030)
pH: 6 (ref 5.0–8.0)

## 2018-08-11 LAB — CBC
HCT: 37.6 % (ref 36.0–46.0)
Hemoglobin: 13 g/dL (ref 12.0–15.0)
MCH: 31.6 pg (ref 26.0–34.0)
MCHC: 34.6 g/dL (ref 30.0–36.0)
MCV: 91.5 fL (ref 78.0–100.0)
PLATELETS: 187 10*3/uL (ref 150–400)
RBC: 4.11 MIL/uL (ref 3.87–5.11)
RDW: 13.5 % (ref 11.5–15.5)
WBC: 14.7 10*3/uL — AB (ref 4.0–10.5)

## 2018-08-11 LAB — COMPREHENSIVE METABOLIC PANEL
ALT: 14 U/L (ref 0–44)
AST: 17 U/L (ref 15–41)
Albumin: 3.4 g/dL — ABNORMAL LOW (ref 3.5–5.0)
Alkaline Phosphatase: 110 U/L (ref 38–126)
Anion gap: 12 (ref 5–15)
BUN: 6 mg/dL (ref 6–20)
CHLORIDE: 103 mmol/L (ref 98–111)
CO2: 18 mmol/L — AB (ref 22–32)
Calcium: 9.3 mg/dL (ref 8.9–10.3)
Creatinine, Ser: 0.63 mg/dL (ref 0.44–1.00)
GFR calc Af Amer: >60 mL/min (ref >60)
Glucose, Bld: 111 mg/dL — ABNORMAL HIGH (ref 70–99)
POTASSIUM: 4.1 mmol/L (ref 3.5–5.1)
SODIUM: 133 mmol/L — AB (ref 135–145)
Total Bilirubin: 0.7 mg/dL (ref 0.3–1.2)
Total Protein: 7.7 g/dL (ref 6.5–8.1)

## 2018-08-11 LAB — PROTEIN / CREATININE RATIO, URINE
CREATININE, URINE: 114 mg/dL
Protein Creatinine Ratio: 0.11 mg/mg{Cre} (ref 0.00–0.15)
TOTAL PROTEIN, URINE: 13 mg/dL

## 2018-08-11 MED ORDER — LABETALOL HCL 200 MG PO TABS: 200.0000 mg | ORAL_TABLET | Freq: Three times a day (TID) | ORAL | 0 refills | Status: DC

## 2018-08-11 NOTE — MAU Provider Note (Addendum)
History     CSN: [id number]  Arrival date and time: 08/11/18 1053   First Provider Initiated Contact with Patient 08/11/18 1141      Chief Complaint  Patient presents with  . Hypertension   HPI Becky Fuller is a 27 y.o. G1P0 at 3139w5d who presents from the office for evaluation of elevated blood pressures. She went for her routine OB appointment and her BP was found to be elevated. She denies any headache, visual changes or epigastric pain. She denies any pain, vaginal bleeding or discharge. Reports good fetal movement.   OB History    Gravida  1   Para      Term      Preterm      AB      Living        SAB      TAB      Ectopic      Multiple      Live Births              Past Medical History:  Diagnosis Date  . Abscess   . Asthma    childhood    Past Surgical History:  Procedure Laterality Date  . CYST EXCISION      History reviewed. No pertinent family history.  Social History   Tobacco Use  . Smoking status: Never Smoker  . Smokeless tobacco: Never Used  Substance Use Topics  . Alcohol use: Not Currently    Comment: not in pregnancy  . Drug use: Never    Allergies:  Allergies  Allergen Reactions  . Tylenol [Acetaminophen] Anaphylaxis    Medications Prior to Admission  Medication Sig Dispense Refill Last Dose  . loratadine (CLARITIN) 10 MG tablet Take 10 mg by mouth daily.       Review of Systems  Constitutional: Negative.  Negative for fatigue and fever.  HENT: Negative.   Respiratory: Negative.  Negative for shortness of breath.   Cardiovascular: Negative.  Negative for chest pain.  Gastrointestinal: Negative.  Negative for abdominal pain, constipation, diarrhea, nausea and vomiting.  Genitourinary: Negative.  Negative for dysuria.  Neurological: Negative.  Negative for dizziness and headaches.   Physical Exam   Blood pressure (!) 137/93, pulse (!) 104, temperature 98.2 F (36.8 C), temperature source Oral, resp. rate  17, height 5\' 4"  (1.626 m), weight 99.8 kg, SpO2 100 %.  Patient Vitals for the past 24 hrs:  BP Temp Temp src Pulse Resp SpO2 Height Weight  08/11/18 1245 (!) 136/108 - - (!) 109 - - - -  08/11/18 1230 (!) 137/96 - - (!) 105 - - - -  08/11/18 1215 (!) 142/102 - - (!) 110 - - - -  08/11/18 1200 (!) 138/100 - - (!) 114 - - - -  08/11/18 1145 (!) 142/97 - - (!) 106 - - - -  08/11/18 1130 (!) 137/93 - - (!) 104 - - - -  08/11/18 1100 (!) 139/103 98.2 F (36.8 C) Oral (!) 113 17 100 % 5\' 4"  (1.626 m) 99.8 kg   Physical Exam  Nursing note and vitals reviewed. Constitutional: She is oriented to person, place, and time. She appears well-developed and well-nourished. No distress.  HENT:  Head: Normocephalic.  Eyes: Pupils are equal, round, and reactive to light.  Cardiovascular: Normal rate, regular rhythm and normal heart sounds.  Respiratory: Effort normal and breath sounds normal. No respiratory distress.  GI: Soft. Bowel sounds are normal. She exhibits no distension.  There is no tenderness.  Neurological: She is alert and oriented to person, place, and time.  Skin: Skin is warm and dry.  Psychiatric: She has a normal mood and affect. Her behavior is normal. Judgment and thought content normal.   Fetal Tracing:  Baseline: 125 Variability: moderate Accels: 15x15 Decels: none  Toco: occasional uc's  MAU Course  Procedures Results for orders placed or performed during the hospital encounter of 08/11/18 (from the past 24 hour(s))  Protein / creatinine ratio, urine     Status: None   Collection Time: 08/11/18 11:01 AM  Result Value Ref Range   Creatinine, Urine 114.00 mg/dL   Total Protein, Urine 13 mg/dL   Protein Creatinine Ratio 0.11 0.00 - 0.15 mg/mg[Cre]  CBC     Status: Abnormal   Collection Time: 08/11/18 11:14 AM  Result Value Ref Range   WBC 14.7 (H) 4.0 - 10.5 K/uL   RBC 4.11 3.87 - 5.11 MIL/uL   Hemoglobin 13.0 12.0 - 15.0 g/dL   HCT 16.137.6 09.636.0 - 04.546.0 %   MCV 91.5  78.0 - 100.0 fL   MCH 31.6 26.0 - 34.0 pg   MCHC 34.6 30.0 - 36.0 g/dL   RDW 40.913.5 81.111.5 - 91.415.5 %   Platelets 187 150 - 400 K/uL  Comprehensive metabolic panel     Status: Abnormal   Collection Time: 08/11/18 11:14 AM  Result Value Ref Range   Sodium 133 (L) 135 - 145 mmol/L   Potassium 4.1 3.5 - 5.1 mmol/L   Chloride 103 98 - 111 mmol/L   CO2 18 (L) 22 - 32 mmol/L   Glucose, Bld 111 (H) 70 - 99 mg/dL   BUN 6 6 - 20 mg/dL   Creatinine, Ser 7.820.63 0.44 - 1.00 mg/dL   Calcium 9.3 8.9 - 95.610.3 mg/dL   Total Protein 7.7 6.5 - 8.1 g/dL   Albumin 3.4 (L) 3.5 - 5.0 g/dL   AST 17 15 - 41 U/L   ALT 14 0 - 44 U/L   Alkaline Phosphatase 110 38 - 126 U/L   Total Bilirubin 0.7 0.3 - 1.2 mg/dL   GFR calc non Af Amer >60 >60 mL/min   GFR calc Af Amer >60 >60 mL/min   Anion gap 12 5 - 15  Urinalysis, Routine w reflex microscopic     Status: Abnormal   Collection Time: 08/11/18 11:26 AM  Result Value Ref Range   Color, Urine YELLOW YELLOW   APPearance HAZY (A) CLEAR   Specific Gravity, Urine 1.016 1.005 - 1.030   pH 6.0 5.0 - 8.0   Glucose, UA NEGATIVE NEGATIVE mg/dL   Hgb urine dipstick NEGATIVE NEGATIVE   Bilirubin Urine NEGATIVE NEGATIVE   Ketones, ur 20 (A) NEGATIVE mg/dL   Protein, ur NEGATIVE NEGATIVE mg/dL   Nitrite NEGATIVE NEGATIVE   Leukocytes, UA LARGE (A) NEGATIVE   RBC / HPF 0-5 0 - 5 RBC/hpf   WBC, UA 0-5 0 - 5 WBC/hpf   Bacteria, UA NONE SEEN NONE SEEN   Squamous Epithelial / LPF 6-10 0 - 5   Mucus PRESENT    MDM Prenatal records from private office reviewed. Labs ordered and reviewed.  UA, UC CBC, CMP, PCR  Consulted with Dr. Ernestina PennaFogleman- ok to discharge patient home and start labetalol 200mg  TID. Will have patient follow up in the office on Monday  Assessment and Plan   1. Gestational hypertension, third trimester   2. [redacted] weeks gestation of pregnancy    -Discharge home  in stable condition -Rx for labetalol sent to patient's pharmacy -Preeclampsia precautions  discussed -Patient advised to follow-up with [facility] on Monday for ROB and NST -Patient may return to MAU as needed or if her condition were to change or worsen  Rolm Bookbinder CNM 08/11/2018, 11:42 AM   Report received from MAU provider with no exam findings c/w PEC. Report also noted non-severe range bps however on review of this note and pt's chart, severe range dbp seen prior to d/c. Pt at this time has already been seen back in office for f/u and appropriate plan has been made.   Lendon Colonel 08/14/2018 2:20 PM

## 2018-08-11 NOTE — Discharge Instructions (Signed)
Hypertension During Pregnancy °Hypertension, commonly called high blood pressure, is when the force of blood pumping through your arteries is too strong. Arteries are blood vessels that carry blood from the heart throughout the body. Hypertension during pregnancy can cause problems for you and your baby. Your baby may be born early (prematurely) or may not weigh as much as he or she should at birth. Very bad cases of hypertension during pregnancy can be life-threatening. °Different types of hypertension can occur during pregnancy. These include: °· Chronic hypertension. This happens when: °? You have hypertension before pregnancy and it continues during pregnancy. °? You develop hypertension before you are [redacted] weeks pregnant, and it continues during pregnancy. °· Gestational hypertension. This is hypertension that develops after the 20th week of pregnancy. °· Preeclampsia, also called toxemia of pregnancy. This is a very serious type of hypertension that develops only during pregnancy. It affects the whole body, and it can be very dangerous for you and your baby. ° °Gestational hypertension and preeclampsia usually go away within 6 weeks after your baby is born. Women who have hypertension during pregnancy have a greater chance of developing hypertension later in life or during future pregnancies. °What are the causes? °The exact cause of hypertension is not known. °What increases the risk? °There are certain factors that make it more likely for you to develop hypertension during pregnancy. These include: °· Having hypertension during a previous pregnancy or prior to pregnancy. °· Being overweight. °· Being older than age 40. °· Being pregnant for the first time or being pregnant with more than one baby. °· Becoming pregnant using fertilization methods such as IVF (in vitro fertilization). °· Having diabetes, kidney problems, or systemic lupus erythematosus. °· Having a family history of hypertension. ° °What are the  signs or symptoms? °Chronic hypertension and gestational hypertension rarely cause symptoms. Preeclampsia causes symptoms, which may include: °· Increased protein in your urine. Your health care provider will check for this at every visit before you give birth (prenatal visit). °· Severe headaches. °· Sudden weight gain. °· Swelling of the hands, face, legs, and feet. °· Nausea and vomiting. °· Vision problems, such as blurred or double vision. °· Numbness in the face, arms, legs, and feet. °· Dizziness. °· Slurred speech. °· Sensitivity to bright lights. °· Abdominal pain. °· Convulsions. ° °How is this diagnosed? °You may be diagnosed with hypertension during a routine prenatal exam. At each prenatal visit, you may: °· Have a urine test to check for high amounts of protein in your urine. °· Have your blood pressure checked. A blood pressure reading is recorded as two numbers, such as "120 over 80" (or 120/80). The first ("top") number is called the systolic pressure. It is a measure of the pressure in your arteries when your heart beats. The second ("bottom") number is called the diastolic pressure. It is a measure of the pressure in your arteries as your heart relaxes between beats. Blood pressure is measured in a unit called mm Hg. A normal blood pressure reading is: °? Systolic: below 120. °? Diastolic: below 80. ° °The type of hypertension that you are diagnosed with depends on your test results and when your symptoms developed. °· Chronic hypertension is usually diagnosed before 20 weeks of pregnancy. °· Gestational hypertension is usually diagnosed after 20 weeks of pregnancy. °· Hypertension with high amounts of protein in the urine is diagnosed as preeclampsia. °· Blood pressure measurements that stay above 160 systolic, or above 110 diastolic, are   signs of severe preeclampsia. ° °How is this treated? °Treatment for hypertension during pregnancy varies depending on the type of hypertension you have and how  serious it is. °· If you take medicines called ACE inhibitors to treat chronic hypertension, you may need to switch medicines. ACE inhibitors should not be taken during pregnancy. °· If you have gestational hypertension, you may need to take blood pressure medicine. °· If you are at risk for preeclampsia, your health care provider may recommend that you take a low-dose aspirin every day to prevent high blood pressure during your pregnancy. °· If you have severe preeclampsia, you may need to be hospitalized so you and your baby can be monitored closely. You may also need to take medicine (magnesium sulfate) to prevent seizures and to lower blood pressure. This medicine may be given as an injection or through an IV tube. °· In some cases, if your condition gets worse, you may need to deliver your baby early. ° °Follow these instructions at home: °Eating and drinking °· Drink enough fluid to keep your urine clear or pale yellow. °· Eat a healthy diet that is low in salt (sodium). Do not add salt to your food. Check food labels to see how much sodium a food or beverage contains. °Lifestyle °· Do not use any products that contain nicotine or tobacco, such as cigarettes and e-cigarettes. If you need help quitting, ask your health care provider. °· Do not use alcohol. °· Avoid caffeine. °· Avoid stress as much as possible. Rest and get plenty of sleep. °General instructions °· Take over-the-counter and prescription medicines only as told by your health care provider. °· While lying down, lie on your left side. This keeps pressure off your baby. °· While sitting or lying down, raise (elevate) your feet. Try putting some pillows under your lower legs. °· Exercise regularly. Ask your health care provider what kinds of exercise are best for you. °· Keep all prenatal and follow-up visits as told by your health care provider. This is important. °Contact a health care provider if: °· You have symptoms that your health care  provider told you may require more treatment or monitoring, such as: °? Fever. °? Vomiting. °? Headache. °Get help right away if: °· You have severe abdominal pain or vomiting that does not get better with treatment. °· You suddenly develop swelling in your hands, ankles, or face. °· You gain 4 lbs (1.8 kg) or more in 1 week. °· You develop vaginal bleeding, or you have blood in your urine. °· You do not feel your baby moving as much as usual. °· You have blurred or double vision. °· You have muscle twitching or sudden tightening (spasms). °· You have shortness of breath. °· Your lips or fingernails turn blue. °This information is not intended to replace advice given to you by your health care provider. Make sure you discuss any questions you have with your health care provider. °Document Released: 08/26/2011 Document Revised: 06/27/2016 Document Reviewed: 05/23/2016 °Elsevier Interactive Patient Education © 2018 [employer] ° °

## 2018-08-11 NOTE — MAU Note (Signed)
Pt in office today for routine visit and b/p was elevated. Denies headache or blurred vision.

## 2018-08-12 LAB — CULTURE, OB URINE: CULTURE: NO GROWTH

## 2018-08-13 ENCOUNTER — Encounter (HOSPITAL_COMMUNITY): Payer: Self-pay | Admitting: *Deleted

## 2018-08-13 ENCOUNTER — Other Ambulatory Visit: Payer: Self-pay

## 2018-08-13 ENCOUNTER — Inpatient Hospital Stay (HOSPITAL_COMMUNITY)
Admission: AD | Admit: 2018-08-13 | Discharge: 2018-08-17 | DRG: 786 | Disposition: A | Discharge status: Home / Self Care | Payer: BC Managed Care – PPO | Attending: Obstetrics & Gynecology | Admitting: Obstetrics & Gynecology

## 2018-08-13 DIAGNOSIS — D649 Anemia, unspecified: Secondary | ICD-10-CM | POA: Diagnosis present

## 2018-08-13 DIAGNOSIS — O9902 Anemia complicating childbirth: Secondary | ICD-10-CM | POA: Diagnosis present

## 2018-08-13 DIAGNOSIS — O99214 Obesity complicating childbirth: Secondary | ICD-10-CM | POA: Diagnosis present

## 2018-08-13 DIAGNOSIS — O324XX0 Maternal care for high head at term, not applicable or unspecified: Secondary | ICD-10-CM | POA: Diagnosis present

## 2018-08-13 DIAGNOSIS — O134 Gestational [pregnancy-induced] hypertension without significant proteinuria, complicating childbirth: Principal | ICD-10-CM | POA: Diagnosis present

## 2018-08-13 DIAGNOSIS — O411230 Chorioamnionitis, third trimester, not applicable or unspecified: Secondary | ICD-10-CM | POA: Diagnosis present

## 2018-08-13 DIAGNOSIS — E669 Obesity, unspecified: Secondary | ICD-10-CM | POA: Diagnosis present

## 2018-08-13 DIAGNOSIS — Z3A37 37 weeks gestation of pregnancy: Secondary | ICD-10-CM | POA: Diagnosis not present

## 2018-08-13 DIAGNOSIS — O3663X0 Maternal care for excessive fetal growth, third trimester, not applicable or unspecified: Secondary | ICD-10-CM | POA: Diagnosis present

## 2018-08-13 DIAGNOSIS — O99892 Other specified diseases and conditions complicating childbirth: Secondary | ICD-10-CM | POA: Diagnosis not present

## 2018-08-13 DIAGNOSIS — O139 Gestational [pregnancy-induced] hypertension without significant proteinuria, unspecified trimester: Secondary | ICD-10-CM | POA: Diagnosis present

## 2018-08-13 HISTORY — DX: Mental disorder, not otherwise specified: F99

## 2018-08-13 LAB — COMPREHENSIVE METABOLIC PANEL
ALBUMIN: 3.2 g/dL — AB (ref 3.5–5.0)
ALK PHOS: 110 U/L (ref 38–126)
ALT: 13 U/L (ref 0–44)
AST: 18 U/L (ref 15–41)
Anion gap: 11 (ref 5–15)
BILIRUBIN TOTAL: 0.3 mg/dL (ref 0.3–1.2)
BUN: 8 mg/dL (ref 6–20)
CALCIUM: 9.2 mg/dL (ref 8.9–10.3)
CO2: 18 mmol/L — AB (ref 22–32)
CREATININE: 0.53 mg/dL (ref 0.44–1.00)
Chloride: 102 mmol/L (ref 98–111)
GFR calc Af Amer: >60 mL/min (ref >60)
GFR calc non Af Amer: >60 mL/min (ref >60)
GLUCOSE: 123 mg/dL — AB (ref 70–99)
Potassium: 3.8 mmol/L (ref 3.5–5.1)
SODIUM: 131 mmol/L — AB (ref 135–145)
TOTAL PROTEIN: 6.8 g/dL (ref 6.5–8.1)

## 2018-08-13 LAB — CBC
HEMATOCRIT: 35.7 % — AB (ref 36.0–46.0)
HEMOGLOBIN: 12.2 g/dL (ref 12.0–15.0)
MCH: 31.1 pg (ref 26.0–34.0)
MCHC: 34.2 g/dL (ref 30.0–36.0)
MCV: 91.1 fL (ref 78.0–100.0)
Platelets: 189 10*3/uL (ref 150–400)
RBC: 3.92 MIL/uL (ref 3.87–5.11)
RDW: 13.5 % (ref 11.5–15.5)
WBC: 14.3 10*3/uL — ABNORMAL HIGH (ref 4.0–10.5)

## 2018-08-13 LAB — PREPARE RBC (CROSSMATCH)

## 2018-08-13 LAB — ABO/RH: ABO/RH(D): O POS

## 2018-08-13 MED ORDER — EPHEDRINE 5 MG/ML INJ: 10.0000 mg | INTRAVENOUS | Status: DC | PRN

## 2018-08-13 MED ORDER — PHENYLEPHRINE 40 MCG/ML (10ML) SYRINGE FOR IV PUSH (FOR BLOOD PRESSURE SUPPORT): 80.0000 ug | PREFILLED_SYRINGE | INTRAVENOUS | Status: DC | PRN

## 2018-08-13 MED ORDER — LABETALOL HCL 200 MG PO TABS
200.0000 mg | ORAL_TABLET | Freq: Three times a day (TID) | ORAL | Status: DC
  Administered 2018-08-13 – 2018-08-14 (×3): 200 mg via ORAL
  Filled 2018-08-13 (×4): qty 1

## 2018-08-13 MED ORDER — LACTATED RINGERS IV SOLN: 500.0000 mL | INTRAVENOUS | Status: DC | PRN

## 2018-08-13 MED ORDER — ONDANSETRON HCL 4 MG/2ML IJ SOLN
4.0000 mg | Freq: Four times a day (QID) | INTRAMUSCULAR | Status: DC | PRN
  Administered 2018-08-14: 4 mg via INTRAVENOUS
  Filled 2018-08-13: qty 2

## 2018-08-13 MED ORDER — FENTANYL 2.5 MCG/ML BUPIVACAINE 1/10 % EPIDURAL INFUSION (WH - ANES)
14.0000 mL/h | INTRAMUSCULAR | Status: DC | PRN
  Administered 2018-08-14 (×2): 14 mL/h via EPIDURAL
  Filled 2018-08-13 (×3): qty 100

## 2018-08-13 MED ORDER — ACETAMINOPHEN 325 MG PO TABS: 650.0000 mg | ORAL_TABLET | ORAL | Status: DC | PRN

## 2018-08-13 MED ORDER — BUTORPHANOL TARTRATE 1 MG/ML IJ SOLN
1.0000 mg | INTRAMUSCULAR | Status: DC | PRN
  Administered 2018-08-13: 1 mg via INTRAVENOUS
  Filled 2018-08-13: qty 1

## 2018-08-13 MED ORDER — OXYCODONE-ACETAMINOPHEN 5-325 MG PO TABS: 1.0000 | ORAL_TABLET | ORAL | Status: DC | PRN

## 2018-08-13 MED ORDER — LACTATED RINGERS IV SOLN
500.0000 mL | Freq: Once | INTRAVENOUS | Status: AC
  Administered 2018-08-14: 500 mL via INTRAVENOUS

## 2018-08-13 MED ORDER — SOD CITRATE-CITRIC ACID 500-334 MG/5ML PO SOLN
30.0000 mL | ORAL | Status: DC | PRN
  Filled 2018-08-13: qty 15

## 2018-08-13 MED ORDER — LACTATED RINGERS IV SOLN
INTRAVENOUS | Status: DC
  Administered 2018-08-13 – 2018-08-14 (×5): via INTRAVENOUS

## 2018-08-13 MED ORDER — OXYTOCIN BOLUS FROM INFUSION: 500.0000 mL | Freq: Once | INTRAVENOUS | Status: DC

## 2018-08-13 MED ORDER — DIPHENHYDRAMINE HCL 50 MG/ML IJ SOLN
12.5000 mg | INTRAMUSCULAR | Status: DC | PRN
  Administered 2018-08-14 (×2): 12.5 mg via INTRAVENOUS
  Filled 2018-08-13: qty 1

## 2018-08-13 MED ORDER — OXYTOCIN 40 UNITS IN LACTATED RINGERS INFUSION - SIMPLE MED
1.0000 m[IU]/min | INTRAVENOUS | Status: DC
  Administered 2018-08-13: 1 m[IU]/min via INTRAVENOUS
  Filled 2018-08-13: qty 1000

## 2018-08-13 MED ORDER — OXYTOCIN 40 UNITS IN LACTATED RINGERS INFUSION - SIMPLE MED: 2.5000 [IU]/h | INTRAVENOUS | Status: DC

## 2018-08-13 MED ORDER — PHENYLEPHRINE 40 MCG/ML (10ML) SYRINGE FOR IV PUSH (FOR BLOOD PRESSURE SUPPORT)
80.0000 ug | PREFILLED_SYRINGE | INTRAVENOUS | Status: DC | PRN
  Filled 2018-08-13 (×2): qty 10

## 2018-08-13 MED ORDER — TERBUTALINE SULFATE 1 MG/ML IJ SOLN: 0.2500 mg | Freq: Once | INTRAMUSCULAR | Status: DC | PRN

## 2018-08-13 MED ORDER — MISOPROSTOL 50MCG HALF TABLET
50.0000 ug | ORAL_TABLET | ORAL | Status: DC
  Administered 2018-08-13: 50 ug via ORAL
  Filled 2018-08-13: qty 1

## 2018-08-13 MED ORDER — LIDOCAINE HCL (PF) 1 % IJ SOLN: 30.0000 mL | INTRAMUSCULAR | Status: DC | PRN

## 2018-08-13 MED ORDER — OXYCODONE-ACETAMINOPHEN 5-325 MG PO TABS: 2.0000 | ORAL_TABLET | ORAL | Status: DC | PRN

## 2018-08-13 NOTE — H&P (Addendum)
Becky Fuller is a 27 y.o. female G1 presenting for labor IOL at 37 weeks for gestational HTN.  1st elevated BP noted 2 days back was sent to MAU and ruled out for preeclampsia, started on Labetalol 200mg  tid and asked to f/up in office in 48 hrs. Office NST was reactive but BPs were -137/96, 131/85, 140/100, 138/87, 144/94 (last was manual). H/o HAs through out pregnancy, none today, no RUQ pain or worsening swelling. Good FMs/ no LOF or VB  [facility]- Dr Almquist/ [facility], started at 8 weeks. Excessive wt gain 50 lbs, HAs off and on. Anxiety/depression hx, no meds.  Last sono 36 weeks - LGA 7'8" at 93%, AC 96%, AFI nl, Vx.    OB History    Gravida  1   Para  0   Term  0   Preterm  0   AB  0   Living  0     SAB  0   TAB  0   Ectopic  0   Multiple  0   Live Births  0          Past Medical History:  Diagnosis Date  . Abscess   . Asthma    childhood  . Mental disorder    Anxiety/depression was taking medications but stopped.    Past Surgical History:  Procedure Laterality Date  . CYST EXCISION     Family History: family history is not on file. Social History:  reports that she has never smoked. She has never used smokeless tobacco. She reports that she drank alcohol. She reports that she does not use drugs.     Maternal Diabetes: No Genetic Screening: Normal Quad screen  Maternal Ultrasounds/Referrals: Normal but growth LGA at 36 wks. Maternal wt gain 50 lbs Fetal Ultrasounds or other Referrals:  None Maternal Substance Abuse:  No Significant Maternal Medications:  None Significant Maternal Lab Results:  Lab values include: Group B Strep negative Other Comments:  Anxiety, depression hx, no meds   ROS History Dilation: 1 Effacement (%): 50 Station: -2 Exam by:: S Earl RNC Height 5\' 4"  (1.626 m), weight 99.7 kg. Exam Physical Exam  Physical exam:  A&O x 3, no acute distress. Pleasant HEENT neg, no thyromegaly Lungs CTA bilat CV RRR, S1S2  normal Abdo soft, non tender, non acute Extr no edema/ tenderness Pelvic above FHT  120s + accels no decels mod variab - i Toco irreg  Prenatal labs: ABO, Rh: --/--/O POS (08/23 1245) Antibody: NEG (08/23 1245) Rubella: Immune (03/20 0000) RPR: Nonreactive (03/20 0000)  HBsAg: Negative (03/20 0000)  HIV: Non-reactive (03/20 0000)  GBS: Negative (08/09 0000)  GLucola normal QUAD neg  Assessment/Plan: 27 yo G1, 37 wks with gestational HTN, here for labor IOL, Cytotec 50mcg buccal, then pitocin after 4 hrs as needed. No evidence of PEC. Cont Labetalol 200mg  q8 hrs. Epidural okay FHT cat I Planning towards SVD, EFW 8 lbs    Robley FriesVaishali R Sherma Vanmetre 08/13/2018, 2:29 PM

## 2018-08-13 NOTE — Anesthesia Pain Management Evaluation Note (Signed)
  [facility] Pain Management Visit Note  Patient: Becky Fuller, 27 y.o., female  "Hello I am a member of the anesthesia team at [facility]. We have an anesthesia team available at all times to provide care throughout the hospital, including epidural management and anesthesia for C-section. I don't know your plan for the delivery whether it a natural birth, water birth, IV sedation, nitrous supplementation, doula or epidural, but we want to meet your pain goals."   1.Was your pain managed to your expectations on prior hospitalizations?   No prior hospitalizations  2.What is your expectation for pain management during this hospitalization?     Epidural  3.How can we help you reach that goal? epidural  Record the patient's initial score and the patient's pain goal.   Pain: 0  Pain Goal: 5 [facility] wants you to be able to say your pain was always managed very well.  Becky Fuller 08/13/2018

## 2018-08-14 ENCOUNTER — Inpatient Hospital Stay (HOSPITAL_COMMUNITY): Payer: BC Managed Care – PPO | Admitting: Anesthesiology

## 2018-08-14 ENCOUNTER — Encounter (HOSPITAL_COMMUNITY): Payer: Self-pay | Admitting: Anesthesiology

## 2018-08-14 ENCOUNTER — Encounter (HOSPITAL_COMMUNITY)
Admission: AD | Disposition: A | Discharge status: Home / Self Care | Payer: Self-pay | Source: Home / Self Care | Attending: Obstetrics & Gynecology

## 2018-08-14 DIAGNOSIS — O99892 Other specified diseases and conditions complicating childbirth: Secondary | ICD-10-CM | POA: Diagnosis not present

## 2018-08-14 LAB — CBC
HCT: 33.1 % — ABNORMAL LOW (ref 36.0–46.0)
HEMATOCRIT: 33.3 % — AB (ref 36.0–46.0)
HEMOGLOBIN: 11.4 g/dL — AB (ref 12.0–15.0)
Hemoglobin: 11.4 g/dL — ABNORMAL LOW (ref 12.0–15.0)
MCH: 31.4 pg (ref 26.0–34.0)
MCH: 31.8 pg (ref 26.0–34.0)
MCHC: 34.4 g/dL (ref 30.0–36.0)
MCHC: 34.2 g/dL (ref 30.0–36.0)
MCV: 91.2 fL (ref 78.0–100.0)
MCV: 92.8 fL (ref 78.0–100.0)
PLATELETS: 167 10*3/uL (ref 150–400)
Platelets: 155 10*3/uL (ref 150–400)
RBC: 3.63 MIL/uL — ABNORMAL LOW (ref 3.87–5.11)
RBC: 3.59 MIL/uL — ABNORMAL LOW (ref 3.87–5.11)
RDW: 13.6 % (ref 11.5–15.5)
RDW: 13.6 % (ref 11.5–15.5)
WBC: 18.5 10*3/uL — ABNORMAL HIGH (ref 4.0–10.5)
WBC: 23 10*3/uL — AB (ref 4.0–10.5)

## 2018-08-14 LAB — RPR: RPR Ser Ql: NONREACTIVE

## 2018-08-14 SURGERY — Surgical Case
Anesthesia: Epidural

## 2018-08-14 MED ORDER — OXYTOCIN 10 UNIT/ML IJ SOLN
INTRAMUSCULAR | Status: AC
  Filled 2018-08-14: qty 4

## 2018-08-14 MED ORDER — LIDOCAINE-EPINEPHRINE (PF) 2 %-1:200000 IJ SOLN
INTRAMUSCULAR | Status: DC | PRN
  Administered 2018-08-14: 3 mL via EPIDURAL
  Administered 2018-08-14 (×2): 2 mL via EPIDURAL
  Administered 2018-08-14: 5 mL via EPIDURAL

## 2018-08-14 MED ORDER — MEPERIDINE HCL 25 MG/ML IJ SOLN: 6.2500 mg | INTRAMUSCULAR | Status: DC | PRN

## 2018-08-14 MED ORDER — LIDOCAINE HCL (PF) 1 % IJ SOLN
INTRAMUSCULAR | Status: DC | PRN
  Administered 2018-08-14: 5 mL via EPIDURAL
  Administered 2018-08-14: 4 mL via EPIDURAL

## 2018-08-14 MED ORDER — SIMETHICONE 80 MG PO CHEW
80.0000 mg | CHEWABLE_TABLET | ORAL | Status: DC
  Administered 2018-08-16: 80 mg via ORAL
  Filled 2018-08-14 (×2): qty 1

## 2018-08-14 MED ORDER — FENTANYL CITRATE (PF) 100 MCG/2ML IJ SOLN
INTRAMUSCULAR | Status: AC
  Filled 2018-08-14: qty 2

## 2018-08-14 MED ORDER — MORPHINE SULFATE (PF) 0.5 MG/ML IJ SOLN
INTRAMUSCULAR | Status: AC
  Filled 2018-08-14: qty 10

## 2018-08-14 MED ORDER — SODIUM BICARBONATE 8.4 % IV SOLN
INTRAVENOUS | Status: AC
  Filled 2018-08-14: qty 50

## 2018-08-14 MED ORDER — SODIUM BICARBONATE 8.4 % IV SOLN
INTRAVENOUS | Status: DC | PRN
  Administered 2018-08-14 (×3): 5 mL via EPIDURAL
  Administered 2018-08-14: 2 mL via EPIDURAL

## 2018-08-14 MED ORDER — FENTANYL CITRATE (PF) 100 MCG/2ML IJ SOLN
INTRAMUSCULAR | Status: DC | PRN
  Administered 2018-08-14: 100 ug via EPIDURAL
  Administered 2018-08-14: 50 ug via INTRAVENOUS
  Administered 2018-08-14 (×2): 100 ug via EPIDURAL

## 2018-08-14 MED ORDER — FENTANYL CITRATE (PF) 100 MCG/2ML IJ SOLN: 100.0000 ug | Freq: Once | INTRAMUSCULAR | Status: DC

## 2018-08-14 MED ORDER — SIMETHICONE 80 MG PO CHEW: 80.0000 mg | CHEWABLE_TABLET | ORAL | Status: DC | PRN

## 2018-08-14 MED ORDER — WITCH HAZEL-GLYCERIN EX PADS: 1.0000 "application " | MEDICATED_PAD | CUTANEOUS | Status: DC | PRN

## 2018-08-14 MED ORDER — KETOROLAC TROMETHAMINE 30 MG/ML IJ SOLN: 30.0000 mg | Freq: Once | INTRAMUSCULAR | Status: DC | PRN

## 2018-08-14 MED ORDER — HYDROMORPHONE HCL 1 MG/ML IJ SOLN
0.2500 mg | INTRAMUSCULAR | Status: DC | PRN
  Administered 2018-08-14: 0.5 mg via INTRAVENOUS

## 2018-08-14 MED ORDER — ONDANSETRON HCL 4 MG/2ML IJ SOLN
INTRAMUSCULAR | Status: AC
  Filled 2018-08-14: qty 2

## 2018-08-14 MED ORDER — CHLOROPROCAINE HCL (PF) 3 % IJ SOLN
INTRAMUSCULAR | Status: AC
  Filled 2018-08-14: qty 20

## 2018-08-14 MED ORDER — PRENATAL MULTIVITAMIN CH
1.0000 | ORAL_TABLET | Freq: Every day | ORAL | Status: DC
  Administered 2018-08-15 – 2018-08-17 (×3): 1 via ORAL
  Filled 2018-08-14 (×3): qty 1

## 2018-08-14 MED ORDER — PROMETHAZINE HCL 25 MG/ML IJ SOLN: 6.2500 mg | INTRAMUSCULAR | Status: DC | PRN

## 2018-08-14 MED ORDER — OXYTOCIN 40 UNITS IN LACTATED RINGERS INFUSION - SIMPLE MED: 2.5000 [IU]/h | INTRAVENOUS | Status: AC

## 2018-08-14 MED ORDER — COCONUT OIL OIL: 1.0000 "application " | TOPICAL_OIL | Status: DC | PRN

## 2018-08-14 MED ORDER — MORPHINE SULFATE (PF) 0.5 MG/ML IJ SOLN
INTRAMUSCULAR | Status: DC | PRN
  Administered 2018-08-14: 3 mg via EPIDURAL

## 2018-08-14 MED ORDER — SODIUM CHLORIDE 0.9 % IV SOLN
2.0000 g | Freq: Four times a day (QID) | INTRAVENOUS | Status: DC
  Administered 2018-08-14: 2 g via INTRAVENOUS
  Filled 2018-08-14 (×2): qty 2

## 2018-08-14 MED ORDER — LIDOCAINE-EPINEPHRINE (PF) 2 %-1:200000 IJ SOLN
INTRAMUSCULAR | Status: AC
  Filled 2018-08-14: qty 20

## 2018-08-14 MED ORDER — TETANUS-DIPHTH-ACELL PERTUSSIS 5-2.5-18.5 LF-MCG/0.5 IM SUSP: 0.5000 mL | Freq: Once | INTRAMUSCULAR | Status: DC

## 2018-08-14 MED ORDER — ONDANSETRON HCL 4 MG/2ML IJ SOLN
INTRAMUSCULAR | Status: DC | PRN
  Administered 2018-08-14: 4 mg via INTRAVENOUS

## 2018-08-14 MED ORDER — SENNOSIDES-DOCUSATE SODIUM 8.6-50 MG PO TABS
2.0000 | ORAL_TABLET | ORAL | Status: DC
  Administered 2018-08-16: 2 via ORAL
  Filled 2018-08-14 (×2): qty 2

## 2018-08-14 MED ORDER — DIPHENHYDRAMINE HCL 25 MG PO CAPS: 25.0000 mg | ORAL_CAPSULE | Freq: Four times a day (QID) | ORAL | Status: DC | PRN

## 2018-08-14 MED ORDER — HYDROMORPHONE HCL 1 MG/ML IJ SOLN
INTRAMUSCULAR | Status: AC
  Filled 2018-08-14: qty 0.5

## 2018-08-14 MED ORDER — SIMETHICONE 80 MG PO CHEW
80.0000 mg | CHEWABLE_TABLET | Freq: Three times a day (TID) | ORAL | Status: DC
  Administered 2018-08-15 – 2018-08-17 (×8): 80 mg via ORAL
  Filled 2018-08-14 (×8): qty 1

## 2018-08-14 MED ORDER — DEXAMETHASONE SODIUM PHOSPHATE 10 MG/ML IJ SOLN
INTRAMUSCULAR | Status: AC
  Filled 2018-08-14: qty 1

## 2018-08-14 MED ORDER — SODIUM CHLORIDE 0.9 % IV SOLN
2.0000 g | Freq: Four times a day (QID) | INTRAVENOUS | Status: AC
  Administered 2018-08-15 (×2): 2 g via INTRAVENOUS
  Filled 2018-08-14 (×5): qty 2

## 2018-08-14 MED ORDER — DEXAMETHASONE SODIUM PHOSPHATE 10 MG/ML IJ SOLN
INTRAMUSCULAR | Status: DC | PRN
  Administered 2018-08-14: 10 mg via INTRAVENOUS

## 2018-08-14 MED ORDER — LABETALOL HCL 200 MG PO TABS
200.0000 mg | ORAL_TABLET | Freq: Three times a day (TID) | ORAL | Status: DC
  Administered 2018-08-15: 200 mg via ORAL
  Filled 2018-08-14: qty 1

## 2018-08-14 MED ORDER — IBUPROFEN 600 MG PO TABS
600.0000 mg | ORAL_TABLET | Freq: Four times a day (QID) | ORAL | Status: DC
  Administered 2018-08-15 – 2018-08-17 (×10): 600 mg via ORAL
  Filled 2018-08-14 (×11): qty 1

## 2018-08-14 MED ORDER — LACTATED RINGERS IV SOLN: INTRAVENOUS | Status: DC

## 2018-08-14 MED ORDER — LACTATED RINGERS IV SOLN
INTRAVENOUS | Status: DC | PRN
  Administered 2018-08-14: 20:00:00 via INTRAVENOUS

## 2018-08-14 MED ORDER — SCOPOLAMINE 1 MG/3DAYS TD PT72
MEDICATED_PATCH | TRANSDERMAL | Status: AC
  Filled 2018-08-14: qty 1

## 2018-08-14 MED ORDER — MIDAZOLAM HCL 2 MG/2ML IJ SOLN
INTRAMUSCULAR | Status: DC | PRN
  Administered 2018-08-14: 2 mg via INTRAVENOUS

## 2018-08-14 MED ORDER — OXYTOCIN 10 UNIT/ML IJ SOLN
INTRAVENOUS | Status: DC | PRN
  Administered 2018-08-14: 40 [IU] via INTRAVENOUS

## 2018-08-14 MED ORDER — CHLOROPROCAINE HCL (PF) 3 % IJ SOLN
INTRAMUSCULAR | Status: DC | PRN
  Administered 2018-08-14: 20 mL

## 2018-08-14 MED ORDER — DIBUCAINE 1 % RE OINT: 1.0000 "application " | TOPICAL_OINTMENT | RECTAL | Status: DC | PRN

## 2018-08-14 MED ORDER — SCOPOLAMINE 1 MG/3DAYS TD PT72
MEDICATED_PATCH | TRANSDERMAL | Status: DC | PRN
  Administered 2018-08-14: 1 via TRANSDERMAL

## 2018-08-14 MED ORDER — SODIUM CHLORIDE 0.9 % IR SOLN
Status: DC | PRN
  Administered 2018-08-14: 1

## 2018-08-14 MED ORDER — MIDAZOLAM HCL 2 MG/2ML IJ SOLN
INTRAMUSCULAR | Status: AC
  Filled 2018-08-14: qty 2

## 2018-08-14 MED ORDER — OXYCODONE HCL 5 MG PO TABS
10.0000 mg | ORAL_TABLET | ORAL | Status: DC | PRN
  Administered 2018-08-16 – 2018-08-17 (×3): 10 mg via ORAL
  Filled 2018-08-14 (×3): qty 2

## 2018-08-14 MED ORDER — OXYCODONE HCL 5 MG PO TABS
5.0000 mg | ORAL_TABLET | ORAL | Status: DC | PRN
  Administered 2018-08-17: 5 mg via ORAL
  Filled 2018-08-14: qty 1

## 2018-08-14 MED ORDER — MENTHOL 3 MG MT LOZG: 1.0000 | LOZENGE | OROMUCOSAL | Status: DC | PRN

## 2018-08-14 SURGICAL SUPPLY — 38 items
BENZOIN TINCTURE PRP APPL 2/3 (GAUZE/BANDAGES/DRESSINGS) ×2 IMPLANT
CHLORAPREP W/TINT 26ML (MISCELLANEOUS) ×2 IMPLANT
CLAMP CORD UMBIL (MISCELLANEOUS) IMPLANT
CLOTH BEACON ORANGE TIMEOUT ST (SAFETY) ×2 IMPLANT
CLSR STERI-STRIP ANTIMIC 1/2X4 (GAUZE/BANDAGES/DRESSINGS) ×2 IMPLANT
DECANTER SPIKE VIAL GLASS SM (MISCELLANEOUS) ×2 IMPLANT
DRSG OPSITE POSTOP 4X10 (GAUZE/BANDAGES/DRESSINGS) ×2 IMPLANT
ELECT REM PT RETURN 9FT ADLT (ELECTROSURGICAL) ×2
ELECTRODE REM PT RTRN 9FT ADLT (ELECTROSURGICAL) ×1 IMPLANT
EXTRACTOR VACUUM KIWI (MISCELLANEOUS) ×2 IMPLANT
EXTRACTOR VACUUM M CUP 4 TUBE (SUCTIONS) IMPLANT
GLOVE BIO SURGEON STRL SZ7 (GLOVE) ×2 IMPLANT
GLOVE BIOGEL PI IND STRL 7.0 (GLOVE) ×2 IMPLANT
GLOVE BIOGEL PI INDICATOR 7.0 (GLOVE) ×2
GOWN STRL REUS W/TWL LRG LVL3 (GOWN DISPOSABLE) ×4 IMPLANT
HOVERMATT SINGLE USE (MISCELLANEOUS) ×2 IMPLANT
KIT ABG SYR 3ML LUER SLIP (SYRINGE) IMPLANT
NEEDLE HYPO 25X5/8 SAFETYGLIDE (NEEDLE) IMPLANT
NS IRRIG 1000ML POUR BTL (IV SOLUTION) ×2 IMPLANT
PACK C SECTION WH (CUSTOM PROCEDURE TRAY) ×2 IMPLANT
PAD OB MATERNITY 4.3X12.25 (PERSONAL CARE ITEMS) ×2 IMPLANT
RTRCTR C-SECT PINK 25CM LRG (MISCELLANEOUS) IMPLANT
RTRCTR WOUND ALEXIS 18CM SML (INSTRUMENTS) ×2
SAVER CELL AAL HAEMONETICS (INSTRUMENTS) ×1 IMPLANT
STRIP CLOSURE SKIN 1/2X4 (GAUZE/BANDAGES/DRESSINGS) IMPLANT
SUT MNCRL 0 VIOLET CTX 36 (SUTURE) ×2 IMPLANT
SUT MONOCRYL 0 CTX 36 (SUTURE) ×2
SUT PLAIN 0 NONE (SUTURE) IMPLANT
SUT PLAIN 2 0 (SUTURE) ×1
SUT PLAIN ABS 2-0 CT1 27XMFL (SUTURE) ×1 IMPLANT
SUT VIC AB 0 CT1 27 (SUTURE) ×2
SUT VIC AB 0 CT1 27XBRD ANBCTR (SUTURE) ×2 IMPLANT
SUT VIC AB 2-0 CT1 27 (SUTURE) ×1
SUT VIC AB 2-0 CT1 TAPERPNT 27 (SUTURE) ×1 IMPLANT
SUT VIC AB 4-0 KS 27 (SUTURE) ×2 IMPLANT
SUT VICRYL 0 TIES 12 18 (SUTURE) IMPLANT
TOWEL OR 17X24 6PK STRL BLUE (TOWEL DISPOSABLE) ×2 IMPLANT
TRAY FOLEY W/BAG SLVR 14FR LF (SET/KITS/TRAYS/PACK) IMPLANT

## 2018-08-14 NOTE — Anesthesia Procedure Notes (Signed)
Epidural Patient location during procedure: OB Start time: 08/14/2018 2:38 AM End time: 08/14/2018 2:42 AM  Staffing Anesthesiologist: Beryle LatheBrock, Thomas E, MD Performed: anesthesiologist   Preanesthetic Checklist Completed: patient identified, pre-op evaluation, timeout performed, IV checked, risks and benefits discussed and monitors and equipment checked  Epidural Patient position: sitting Prep: DuraPrep Patient monitoring: continuous pulse ox and blood pressure Approach: midline Location: L2-L3 Injection technique: LOR saline  Needle:  Needle type: Tuohy  Needle gauge: 17 G Needle length: 9 cm Needle insertion depth: 5 cm Catheter size: 19 Gauge Catheter at skin depth: 10 cm Test dose: negative and Other (1% lidocaine)  Assessment Events: blood not aspirated  Additional Notes Patient identified. Risks including, but not limited to, bleeding, infection, nerve damage, paralysis, inadequate analgesia, blood pressure changes, nausea, vomiting, allergic reaction, postpartum back pain, itching, and headache were discussed. Patient expressed understanding and wished to proceed. Sterile prep and drape, including hand hygiene, mask, and sterile gloves were used. The patient was positioned and the spine was prepped. The skin was anesthetized with lidocaine. No paraesthesia or other complication noted. The patient did not experience any signs of intravascular injection such as tinnitus or metallic taste in mouth, nor signs of intrathecal spread such as rapid motor block. Please see nursing notes for vital signs. The patient tolerated the procedure well.   Leslye Peerhomas Brock, MDReason for block:procedure for pain

## 2018-08-14 NOTE — Progress Notes (Signed)
Becky Fuller is a 27 y.o. G1P0000 at 6885w1d by ultrasound admitted for IOL for GHTN   Subjective: Pain better since epidural bolus. Has fever   Objective: BP 137/80   Pulse (!) 108   Temp (!) 101 F (38.3 C) (Oral)   Resp 16   Ht 5\' 4"  (1.626 m)   Wt 99.7 kg   SpO2 97%   BMI 37.73 kg/m  I/O last 3 completed shifts: In: 1386.8 [I.V.:1386.8] Out: 1000 [Urine:1000]  FHT 140s (baseline change from 120 to 140) but mod variab, + accels, no decels - cat I UC:   irregular, every 2-5 minutes. impInsed resting tone in b/w UCs. Reduce pitocin from 12 to 6 units again SVE:   Dilation: 7.5 Effacement (%): 100 Station: 0, Plus 1 Exam by:: Dr. Bubber Rothert/ C. Derrill KayGoodman, RN Caput and moulding, borderline mid pelvis ?  Assessment / Plan: Protracted active labor, IOL for GHTN- stable BPs, no antiHTN med needed Fever- possible chorioamnionitis, start Mefoxin 2 gm q 6hrs.. Pt allergic to tylenol.   Labor: Protracted labor, 5-6 cm at 11.30 AM now at 7-8 cm Preeclampsia:  No PEC Fetal Wellbeing:  Category I Pain Control:  Epidural I/D:  n/a Anticipated MOD:  Guarded, assess in 1 hr if any change Reduce pitocin and place in exaggerated [employer]  Becky Fuller 08/14/2018, 7:04 PM

## 2018-08-14 NOTE — Anesthesia Postprocedure Evaluation (Signed)
Anesthesia Post Note  Patient: Hubbard Hartshornshley D Dawkins  Procedure(s) Performed: CESAREAN SECTION (N/A )     Patient location during evaluation: PACU Anesthesia Type: Epidural Level of consciousness: awake Pain management: pain level controlled Vital Signs Assessment: post-procedure vital signs reviewed and stable Respiratory status: spontaneous breathing Cardiovascular status: stable Postop Assessment: no headache, no backache, epidural receding, patient able to bend at knees and no apparent nausea or vomiting Anesthetic complications: no    Last Vitals:  Vitals:   08/14/18 2230 08/14/18 2300  BP: 127/80 129/76  Pulse: (!) 112 (!) 106  Resp: 20 18  Temp: 37.2 C 37.7 C  SpO2: 97% 98%    Last Pain:  Vitals:   08/14/18 2300  TempSrc: Oral  PainSc: 0-No pain   Pain Goal:                 Tasheem Elms JR,JOHN Ebony Rickel

## 2018-08-14 NOTE — Anesthesia Preprocedure Evaluation (Addendum)
Anesthesia Evaluation  Patient identified by MRN, date of birth, ID band Patient awake    Reviewed: Allergy & Precautions, NPO status , Patient's Chart, lab work & pertinent test results  History of Anesthesia Complications Negative for: history of anesthetic complications  Airway Mallampati: II  TM Distance: >3 FB Neck ROM: Full    Dental  (+) Dental Advisory Given   Pulmonary asthma ,    breath sounds clear to auscultation       Cardiovascular hypertension (Gestational),  Rhythm:Regular Rate:Normal     Neuro/Psych Anxiety Depression negative neurological ROS     GI/Hepatic negative GI ROS, Neg liver ROS,   Endo/Other   Obesity   Renal/GU negative Renal ROS  negative genitourinary   Musculoskeletal negative musculoskeletal ROS (+)   Abdominal (+) + obese,   Peds  Hematology  (+) anemia ,   Anesthesia Other Findings Hyponatremia   Reproductive/Obstetrics (+) Pregnancy                             Anesthesia Physical Anesthesia Plan  ASA: II  Anesthesia Plan: Epidural   Post-op Pain Management:    Induction:   PONV Risk Score and Plan: 2 and Treatment may vary due to age or medical condition  Airway Management Planned: Natural Airway  Additional Equipment: None  Intra-op Plan:   Post-operative Plan:   Informed Consent: I have reviewed the patients History and Physical, chart, labs and discussed the procedure including the risks, benefits and alternatives for the proposed anesthesia with the patient or authorized representative who has indicated his/her understanding and acceptance.     Plan Discussed with: Anesthesiologist  Anesthesia Plan Comments: (For C/S with labor epidural)       Anesthesia Quick Evaluation

## 2018-08-14 NOTE — Progress Notes (Signed)
Becky Fuller is a 27 y.o. G1P0000 at 2366w1d by ultrasound admitted for IOL for GHTN   Subjective: None. Didn't sleep much last night. No HA/SOB  Objective: BP 119/83   Pulse 97   Temp 98.1 F (36.7 C) (Oral)   Resp 16   Ht 5\' 4"  (1.626 m)   Wt 99.7 kg   SpO2 97%   BMI 37.73 kg/m  I/O last 3 completed shifts: In: 1386.8 [I.V.:1386.8] Out: -  No intake/output data recorded.  VS range-  Pulse Rate:  88-107 Resp:  15-20 BP: 100-151/(62-104) SpO2:  97 %-100 %   FHT:  FHR: 120 bpm, variability: moderate,  accelerations:  Present,  decelerations:  Absent UC:   irregular, every 2-5 minutes SVE:   Dilation: 2.5 Effacement (%): 100 Station: -2 Exam by:: Dr. Juliene PinaMody Caput noted.  IUPC placed  Assessment / Plan: Augmentation of labor, progressing well, hoping she'll enter active phase now since effaced.  GHTN- stable BPs, low since epidural. No antiHTN med needed   Labor: Latent labor Preeclampsia:  No PEC Fetal Wellbeing:  Category I Pain Control:  Epidural I/D:  n/a Anticipated MOD:  working towards vag delivery, EFW 7.1/2- 8 lbs   Becky Fuller 08/14/2018, 8:55 AM

## 2018-08-14 NOTE — Op Note (Addendum)
Cesarean Section Procedure Note   Becky Fuller 08/14/2018  Indications:  Arrest of dilatation at 7-8 cm, chorioamnionitis. Induction for gestational HTN at 37 weeks.  Protracted active labor followed by arrest of dilatation.   Pre-operative Diagnosis: Primary cesarean section due to failure to progress.                                               Gestational HTN                                              Chorioamnionitis   Post-operative Diagnosis: Same   Surgeon: Shea EvansMody, Shameca Landen, MD  Assistants: Arlan Organaniela Paul, CNM   Anesthesia: Epidural    Procedure Details:  The patient was seen in the Labor Room. The risks, benefits, complications, treatment options, and expected outcomes were discussed with the patient. The patient concurred with the proposed plan, giving informed consent. identified as Becky Fuller and the procedure verified as C-Section Delivery. A Time Out was held and the above information confirmed. Patient received 2 gm Cefoxitin just before moving to the OR due to chorioamnionitis and was within 1 hr of skin incision so another antibiotic was not indicated.  After induction of anesthesia, the patient was draped and prepped in the usual sterile manner. 10 cc of 3% Nesacaine injected in skin due to patient having skin sensitivity to testing. A Pfannenstiel incision was made and carried down through the subcutaneous tissue to the fascia. Fascial incision was made and extended transversely. The fascia was separated from the underlying rectus tissue superiorly and inferiorly. The peritoneum was identified and entered. Peritoneal incision was extended longitudinally. Alexis retractor was placed. The utero-vesical peritoneal reflection was incised transversely and the bladder flap was bluntly freed from the lower uterine segment. A low transverse uterine incision was made. Baby's shoulder was noted at the incision level. Head was asynclitic and wedged. OR circulating RN Veronica  elevated the head from per vaginal hand and the head was brought to lower segment but was in OP position. Head was rotated and was about to place vacuum to deliver it but with more manipulation and fundal pressure, head delivery was achieved. Baby Boy was delivered at 8.26 pm on 08/14/2018. baby appeared stunned, so he was stimulated, cord clamped and cut and handed off to NICU team in attendance. Apgar scores of 7 at one minute and 9 at five minutes. Cord ph was sent. Cord blood was obtained for evaluation. The placenta was removed Intact and appeared normal. The uterine outline, tubes and ovaries appeared normal}. The uterine incision was closed with running locked sutures of 0 Monocryl followed by a second imbricating layer. Hemostasis was observed. Alexis removed. Peritoneal closure done with 2-0 Vicryl. The fascia was then reapproximated with running sutures of 0Vicryl. The subcuticular closure was performed using 2-0plain gut. The skin was closed with 4-0Vicryl.   Instrument, sponge, and needle counts were correct prior the abdominal closure and were correct at the conclusion of the case.    Findings: Baby boy was in mid pelvic arrest with asynclitic head. Delivered achieved by elevated fetal head per vagina by circulating RN and then delivering cephalic from low transverse hysterotomy. Apgars 7, 9 at 1 and 5 minutes.  Cord gas sent. Normal tubes, ovaries and uterus.  Placenta to pathology for chorioamnionitis, Cord gas 7.31 (arterial)  Estimated Blood Loss: 855 mL   Total IV Fluids: 2000 ml LR  Urine Output: 100CC OF clear urine  Specimens: Cord gas, cord blood, placenta    Complications: no complications  Disposition: PACU - hemodynamically stable.   Maternal Condition: stable   Baby condition / location:  Couplet care / Skin to Skin  Attending Attestation: I performed the procedure.   Signed: Surgeon(s): Shea Evans, MD

## 2018-08-14 NOTE — Progress Notes (Addendum)
Hubbard Hartshornshley D Septer is a 27 y.o. G1P0000 at 7357w1d by ultrasound admitted for IOL for GHTN   Subjective: Pelvic vaginal pain, sudden onset   Objective: BP 139/82   Pulse (!) 109   Temp 98.9 F (37.2 C) (Oral)   Resp 16   Ht 5\' 4"  (1.626 m)   Wt 99.7 kg   SpO2 97%   BMI 37.73 kg/m  I/O last 3 completed shifts: In: 1386.8 [I.V.:1386.8] Out: -  Total I/O In: -  Out: 1000 [Urine:1000]  FHR: 120 bpm, variability: moderate,  accelerations:  Present,  decelerations:  Absent UC:   irregular, every 2-5 minutes. Increased resting tone in b/w UCs. Reduce pitocin from 12 to 6 units again SVE:   Dilation: 6 Effacement (%): 100(anterior portion edematous) Station: 0, Plus 1 Exam by:: Dr. Juliene PinaMody Caput and moulding, borderline mid pelvis ?  Assessment / Plan: Protracted active labor, IOL for GHTN- stable BPs, no antiHTN med needed   Labor: Protracted labor, 5-6 cm at 11.30 AM now at 6 cm Preeclampsia:  No PEC Fetal Wellbeing:  Category I Pain Control:  Epidural I/D:  n/a Anticipated MOD:  Guarded, assess in 1 hr if any change Reduce pitocin and place in exaggerated [employer]  Brittane Grudzinski R Gena Laski 08/14/2018, 6:05 PM

## 2018-08-14 NOTE — Transfer of Care (Signed)
2Immediate Anesthesia Transfer of Care Note  Patient: Becky Fuller  Procedure(s) Performed: CESAREAN SECTION (N/A )  Patient Location: PACU  Anesthesia Type:Epidural  Level of Consciousness: awake, alert , oriented and patient cooperative  Airway & Oxygen Therapy: Patient Spontanous Breathing  Post-op Assessment: Report given to RN, Post -op Vital signs reviewed and stable and Patient moving all extremities X 4  Post vital signs: Reviewed and stable  Last Vitals:  Vitals Value Taken Time  BP    Temp    Pulse 117 08/14/2018  9:15 PM  Resp 22 08/14/2018  9:15 PM  SpO2 97 % 08/14/2018  9:15 PM  Vitals shown include unvalidated device data.  Last Pain:  Vitals:   08/14/18 1900  TempSrc:   PainSc: 0-No pain         Complications: No apparent anesthesia complications

## 2018-08-15 ENCOUNTER — Encounter (HOSPITAL_COMMUNITY): Payer: Self-pay | Admitting: Obstetrics & Gynecology

## 2018-08-15 LAB — CBC
HCT: 32.3 % — ABNORMAL LOW (ref 36.0–46.0)
Hemoglobin: 10.8 g/dL — ABNORMAL LOW (ref 12.0–15.0)
MCH: 31.2 pg (ref 26.0–34.0)
MCHC: 33.4 g/dL (ref 30.0–36.0)
MCV: 93.4 fL (ref 78.0–100.0)
PLATELETS: 201 10*3/uL (ref 150–400)
RBC: 3.46 MIL/uL — ABNORMAL LOW (ref 3.87–5.11)
RDW: 13.9 % (ref 11.5–15.5)
WBC: 26.8 10*3/uL — AB (ref 4.0–10.5)

## 2018-08-15 MED ORDER — LABETALOL HCL 100 MG PO TABS
100.0000 mg | ORAL_TABLET | Freq: Three times a day (TID) | ORAL | Status: DC
  Administered 2018-08-16: 100 mg via ORAL
  Filled 2018-08-15 (×2): qty 1

## 2018-08-15 NOTE — Anesthesia Postprocedure Evaluation (Signed)
Anesthesia Post Note  Patient: Becky Fuller  Procedure(s) Performed: CESAREAN SECTION (N/A )     Patient location during evaluation: Mother Baby Anesthesia Type: Epidural Level of consciousness: awake and alert and oriented Pain management: satisfactory to patient Vital Signs Assessment: post-procedure vital signs reviewed and stable Respiratory status: respiratory function stable Cardiovascular status: stable Postop Assessment: no headache, no backache, epidural receding, patient able to bend at knees, no signs of nausea or vomiting and adequate PO intake Anesthetic complications: no    Last Vitals:  Vitals:   08/15/18 0445 08/15/18 0740  BP:  99/61  Pulse:  80  Resp:  20  Temp:  36.9 C  SpO2: 98%     Last Pain:  Vitals:   08/15/18 0740  TempSrc: Oral  PainSc:    Pain Goal:                 Becky Fuller

## 2018-08-15 NOTE — Progress Notes (Signed)
Subjective: POD# 1 Information for the patient's newborn:  Becky Fuller, Becky Fuller [[id number]][[id number]]  female  Reports feeling tired but well, OOB and ambulating in room. Feeding: breast and bottle Patient reports tolerating PO.  Breast symptoms: difficulty latching baby Pain controlled with PO meds Denies HA/SOB/C/P/N/V/dizziness. Flatus present. She reports vaginal bleeding as normal, without clots.  She is ambulating, Foley in place.     Objective:   VS:    Vitals:   08/15/18 0158 08/15/18 0300 08/15/18 0445 08/15/18 0740  BP: (!) 114/54   99/61  Pulse: 84   80  Resp: 18 18  20   Temp: 98.5 F (36.9 C) 98.8 F (37.1 C)  98.4 F (36.9 C)  TempSrc: Oral Oral  Oral  SpO2: 100% 97% 98%   Weight:      Height:          Intake/Output Summary (Last 24 hours) at 08/15/2018 0909 Last data filed at 08/15/2018 0700 Gross per 24 hour  Intake 2800 ml  Output 4008 ml  Net -1208 ml        Recent Labs    08/14/18 2145 08/15/18 0642  WBC 23.0* 26.8*  HGB 11.4* 10.8*  HCT 33.3* 32.3*  PLT 167 201     Blood type: --/--/O POS, O POS Performed at [facility], [address] [place], [place] [address]  (08/23 1245)  Rubella: Immune (03/20 0000)     Physical Exam:  General: alert, cooperative and no distress CV: Regular rate and rhythm Resp: clear Abdomen: soft, nontender, normal bowel sounds Incision: clean, dry and intact Uterine Fundus: firm, below umbilicus, nontender Lochia: minimal Ext: edema +2 pedal   Assessment/Plan: 27 y.o.   POD# 1. G1P1001                  Active Problems:   Gestational hypertension  - BP mostly wnl, low BP this AM, on Labetalol 200 mg TID  - hold labetalol for BP < 100/60, changed labetalol to 100 mg TID   Delivery by emergency cesarean   Chorio  - afebrile since delivery, tachycardia resolved, continue IV ABX until 24 hrs afebrile   Doing well, stable.               Advance diet as tolerated Encourage rest when baby rests Breastfeeding  support Encourage to ambulate Routine post-op care  Neta Mendsaniela C Paul, CNM, MSN 08/15/2018, 9:09 AM

## 2018-08-15 NOTE — Progress Notes (Signed)
CSW received consult for MOB due to history of depression and anxiety. CSW met with MOB and baby [name] to complete discussion. MOB confirms diagnosis history, but states she had more anxiety than depression. MOB states she was diagnosed in 2012 when she was working and attending school full time. MOB states she was formerly on Celexa and Zoloft but ceased taking both medications due to the side effects she experienced with mood swings. MOB reports being able to handle her symptoms on her on without medication. MOB does not see a therapist and doesn't feel she needs one at this time. MOB reports a good family support system. This is MOB's first child. MOB and CSW discussed postpartum depression and baby blues period, MOB stated understanding and agreed to reach out for assistance if needs arise. No further needs to address, no barriers to discharge.  Becky Fuller, MSW, [place] Social Worker [facility] [phone]

## 2018-08-15 NOTE — Lactation Note (Addendum)
This note was copied from a baby's chart. Lactation Consultation Note  Patient Name: Becky Fuller Scheryl Martenshley Tristan ZOXWR'UToday's Date: 08/15/2018 Reason for consult: Initial assessment;Primapara;1st time breastfeeding;Early term 5337-38.6wks  4019 hours old early term baby who is being mostly formula fed by his mother, she's a P1 and baby is on Similac 19 calorie formula. Mom already knows how to hand express, she couldn't do demonstration of hand expression to [facility] because she had baby STS, baby just had his bath. Mom is not putting baby to the breast, when offered assistance with latch, she politely declined stating that baby is due for his feeding and she's just going to do a bottle. Mom voiced she's just going to pump and bottle, praised her for her efforts of providing breastmilk for her baby.  She brought her own Medela DEBP from home and asked LC help to assembly it. Pump instructions, cleaning and storage were reviewed, as well as milk storage guidelines. Mom was very throughout with her questions, she asked if she could also sanitize her pump parts. LC recommended sanitizing once a day according to CDC guidelines. LC brought soap and two basins for pump cleaning, mom will sanitize her parts once she's at home.  Encouraged mom to pump every 3 hours and at least once at night, 6-8 pumping sessions in 24 hours. Asked her to call for assistance if she changes her mind and would like to take baby to the breast at some point, discussed feeding on cues and cluster feeding. Supplementation guidelines, BF brochure, BF resources and feeding diary was reviewed. Parents reported all questions were answered, they're both aware of LC services and will call PRN.  Maternal Data Formula Feeding for Exclusion: Yes Reason for exclusion: Mother's choice to formula and breast feed on admission Has patient been taught Hand Expression?: Yes Does the patient have breastfeeding experience prior to this delivery?: No  Feeding    Interventions Interventions: Breast feeding basics reviewed;DEBP;Skin to skin  Lactation Tools Discussed/Used Tools: Pump Breast pump type: Double-Electric Breast Pump WIC Program: No Pump Review: Setup, frequency, and cleaning;Milk Storage Initiated by:: MPeck Date initiated:: 08/15/18   Consult Status Consult Status: Follow-up Date: 08/16/18 Follow-up type: In-patient    Cylas Falzone Venetia ConstableS Abdulahad Mederos 08/15/2018, 3:44 PM

## 2018-08-15 NOTE — Addendum Note (Signed)
Addendum  created 08/15/18 0841 by Graciela HusbandsFussell, Kindell Strada O, CRNA   Sign clinical note

## 2018-08-15 NOTE — Progress Notes (Signed)
Attempted to give last dose of IV antibiotic at 2015. Patient complained of burning and swelling after flush with Normal Saline. RN noticed IV appears infiltrated, educated patient that this was the last dose of antibiotics that she was receiving for fever and possible infection. To give the last dose of antibiotics a new IV would have to be restarted. Pt refused. Ivonne Andrewaniella Paul. CNM notified of situation and patient status. Daniella said to monitor temperature of patient and if fever returns to restart IV and to give antibiotics, otherwise patient can go without it. Will continue to monitor.

## 2018-08-16 NOTE — Progress Notes (Addendum)
Postop day #2, status post primary cesarean section after induction of labor for gestational hypertension  Husband reports no issues. Patient op and showering  Vitals:   08/15/18 2045 08/16/18 0005 08/16/18 0602 08/16/18 0800  BP: 109/68 123/79 (!) 104/55   Pulse: 95 (!) 103 93   Resp: 16 14 16    Temp: 98.1 F (36.7 C) 98.7 F (37.1 C) (!) 97.4 F (36.3 C) 98 F (36.7 C)  TempSrc: Oral Oral Oral Oral  SpO2: 98%  97%   Weight:      Height:         Physical exam: Deferred as patient shower  CBC Latest Ref Rng & Units 08/15/2018 08/14/2018 08/14/2018  WBC 4.0 - 10.5 K/uL 26.8(H) 23.0(H) 18.5(H)  Hemoglobin 12.0 - 15.0 g/dL 10.8(L) 11.4(L) 11.4(L)  Hematocrit 36.0 - 46.0 % 32.3(L) 33.3(L) 33.1(L)  Platelets 150 - 400 K/uL 201 167 155    Assessment and plan: Postop day #2 status post primary cesarean section for arrest of descent after induction of labor for gestational hypertension. - Routine postop care - Gestational hypertension. Patient has labetalol 100 mg 3 times a day since delivery however her blood pressures remain very low. Will DC labetalol and follow blood pressures clinically if patient does not need to restart BP meds while in house we'll plan one week follow-up in the office for assessment of blood pressure.  Lendon ColonelKelly A Kendricks Reap 08/16/2018 9:14 AM    Back to see pt- up sitting in chair, sobbing about pain. Has not taken any narcotic meds. No fevers, tolerating regular po. No emesis.   Nurse reports pt has declined pain meds and has not been up much due to fear of pain and 'tearing sutures'.   IV infiltrated last night, was receiving abx for fever in labor/ chorio  PE: Vitals:   08/15/18 2045 08/16/18 0005 08/16/18 0602 08/16/18 0800  BP: 109/68 123/79 (!) 104/55   Pulse: 95 (!) 103 93   Resp: 16 14 16    Temp: 98.1 F (36.7 C) 98.7 F (37.1 C) (!) 97.4 F (36.3 C) 98 F (36.7 C)  TempSrc: Oral Oral Oral Oral  SpO2: 98%  97%   Weight:      Height:        Gen: up in chair, sobbing LE: 3+ edema Inc: dressed, no staining Abd: no rebound, voluntary guarding present, no distension, mild tenderness/ appropriate, fundus at umbilicus and appropriately tender  A/P: POD#2, PCS arrest of descent after IOL for gest htn  Bps stable off meds, cont to monitor abd pain, likely lack of pain meds. Cont to reassess, check CBC tomorrow to assess for climbing WBC. Consider repeat abx with fevers Consent for circ  Lendon ColonelKelly A Roald Lukacs 08/16/2018 2:16 PM

## 2018-08-17 LAB — TYPE AND SCREEN
ABO/RH(D): O POS
Antibody Screen: NEGATIVE
Unit division: 0
Unit division: 0

## 2018-08-17 LAB — CBC WITH DIFFERENTIAL/PLATELET
BASOS PCT: 0 %
Basophils Absolute: 0 10*3/uL (ref 0.0–0.1)
EOS ABS: 0.3 10*3/uL (ref 0.0–0.7)
EOS PCT: 2 %
HCT: 29.1 % — ABNORMAL LOW (ref 36.0–46.0)
Hemoglobin: 9.8 g/dL — ABNORMAL LOW (ref 12.0–15.0)
LYMPHS ABS: 2.9 10*3/uL (ref 0.7–4.0)
Lymphocytes Relative: 19 %
MCH: 31.4 pg (ref 26.0–34.0)
MCHC: 33.7 g/dL (ref 30.0–36.0)
MCV: 93.3 fL (ref 78.0–100.0)
MONOS PCT: 4 %
Monocytes Absolute: 0.5 10*3/uL (ref 0.1–1.0)
Neutro Abs: 11.4 10*3/uL — ABNORMAL HIGH (ref 1.7–7.7)
Neutrophils Relative %: 75 %
PLATELETS: 204 10*3/uL (ref 150–400)
RBC: 3.12 MIL/uL — ABNORMAL LOW (ref 3.87–5.11)
RDW: 13.8 % (ref 11.5–15.5)
WBC: 15.1 10*3/uL — ABNORMAL HIGH (ref 4.0–10.5)

## 2018-08-17 LAB — BPAM RBC
BLOOD PRODUCT EXPIRATION DATE: 201909232359
Blood Product Expiration Date: 201909232359
UNIT TYPE AND RH: 5100
Unit Type and Rh: 5100

## 2018-08-17 MED ORDER — NALBUPHINE HCL 10 MG/ML IJ SOLN: 5.0000 mg | INTRAMUSCULAR | Status: DC | PRN

## 2018-08-17 MED ORDER — DIPHENHYDRAMINE HCL 25 MG PO CAPS: 25.0000 mg | ORAL_CAPSULE | ORAL | Status: DC | PRN

## 2018-08-17 MED ORDER — DIPHENHYDRAMINE HCL 50 MG/ML IJ SOLN: 12.5000 mg | INTRAMUSCULAR | Status: DC | PRN

## 2018-08-17 MED ORDER — KETOROLAC TROMETHAMINE 30 MG/ML IJ SOLN: 30.0000 mg | Freq: Four times a day (QID) | INTRAMUSCULAR | Status: DC | PRN

## 2018-08-17 MED ORDER — IBUPROFEN 800 MG PO TABS: 800.0000 mg | ORAL_TABLET | Freq: Four times a day (QID) | ORAL | 0 refills | Status: DC

## 2018-08-17 MED ORDER — NALBUPHINE HCL 10 MG/ML IJ SOLN: 5.0000 mg | Freq: Once | INTRAMUSCULAR | Status: DC | PRN

## 2018-08-17 MED ORDER — NALOXONE HCL 0.4 MG/ML IJ SOLN: 0.4000 mg | INTRAMUSCULAR | Status: DC | PRN

## 2018-08-17 MED ORDER — SODIUM CHLORIDE 0.9% FLUSH: 3.0000 mL | INTRAVENOUS | Status: DC | PRN

## 2018-08-17 MED ORDER — OXYCODONE HCL 5 MG PO TABS: 5.0000 mg | ORAL_TABLET | ORAL | 0 refills | Status: DC | PRN

## 2018-08-17 MED ORDER — ONDANSETRON HCL 4 MG/2ML IJ SOLN: 4.0000 mg | Freq: Three times a day (TID) | INTRAMUSCULAR | Status: DC | PRN

## 2018-08-17 MED ORDER — SCOPOLAMINE 1 MG/3DAYS TD PT72
1.0000 | MEDICATED_PATCH | Freq: Once | TRANSDERMAL | Status: DC
  Filled 2018-08-17: qty 1

## 2018-08-17 MED ORDER — NALOXONE HCL 4 MG/10ML IJ SOLN
1.0000 ug/kg/h | INTRAVENOUS | Status: DC | PRN
  Filled 2018-08-17: qty 5

## 2018-08-17 NOTE — Progress Notes (Signed)
POSTOPERATIVE DAY # 3 S/P CS - failure to progress / gestational hypertension  S:         Reports feeling ok - pain still hurts / ready to go home             Tolerating po intake / no nausea / no vomiting / + flatus / + BM             Bleeding is light             Pain controlled with motrin and OXY             Up ad lib / ambulatory/ voiding QS  Newborn breast & Bottle with formula supplement   O:  VS: BP 122/83 (BP Location: Right Arm)   Pulse 94   Temp 98 F (36.7 C) (Oral)   Resp 18   Ht 5\' 4"  (1.626 m)   Wt 99.7 kg   SpO2 98%   Breastfeeding? Unknown   BMI 37.73 kg/m    BP: 122/83 - 117/67 - 118/73 - 104/55  LABS:              Recent Labs    08/15/18 0642 08/17/18 0510  WBC 26.8* 15.1*  HGB 10.8* 9.8*  PLT 201 204               Bloodtype: --/--/O POS, O POS Performed at [facility], [address] [place], [place] [address]  (08/23 1245)  Rubella: Immune (03/20 0000)                                 Physical Exam:             Alert and Oriented X3  Lungs: Clear and unlabored  Heart: regular rate and rhythm / no mumurs  Abdomen: soft, non-tender, non-distended              Fundus: firm, non-tender, U-1             Dressing intact              Incision:  approximated with suture / no erythema / no ecchymosis / no drainage  Perineum: intact  Lochia: light  Extremities: no edema, no calf pain or tenderness, negative Homans  A:        POD # 3 S/P CS            Gestational hypertension - normotensive without medications            No evidence of preeclampsia  P:        Routine postoperative care              DC home - wants to come to office for dressing removal   Marlinda Mikeanya Bailey CNM, MSN, [facility] 08/17/2018, 8:01 AM

## 2018-08-17 NOTE — Discharge Summary (Signed)
OB Discharge Summary  Patient Name: Becky Fuller DOB: 1991/07/09 MRN: [id number]  Date of admission: 08/13/2018  Admitting diagnosis: 37wks induction  Intrauterine pregnancy: [redacted]w[redacted]d     Secondary diagnosis: Gestational Hypertension   Date of discharge: 08/17/2018    Discharge diagnosis: Term Pregnancy Delivered  / POD 3 s/p primary CS - arrest of descent   Prenatal history: G1P1001   EDC : 09/03/2018, Alternate EDD Entry  Prenatal care at [facility]  Primary provider : Mody Prenatal course complicated by gestational hypertension  Prenatal Labs: ABO, Rh: --/--/O POS, O POS Performed at [facility], [address] [place], [place] [address]  [id number]/23 1245)  Antibody: NEG (08/23 1245) Rubella: Immune (03/20 0000)   RPR: Non Reactive (08/23 1245)  HBsAg: Negative (03/20 0000)  HIV: Non-reactive (03/20 0000)  GBS: Negative (08/09 0000)                                    Hospital course:  Induction of Labor With Cesarean Section  27 y.o. yo G1P1001 at [redacted]w[redacted]d was admitted to the hospital 08/13/2018 for induction of labor due to gestational hypertension. Patient had a labor course significant for arrest of descent. The patient went for cesarean section due to Arrest of Descent, and delivered a Viable infant,08/14/2018  Membrane Rupture Time/Date: 6:03 PM ,08/13/2018   Details of operation can be found in separate operative Note.  Patient had an uncomplicated postpartum course. She is ambulating, tolerating a regular diet, passing flatus, and urinating well.  Patient is discharged home in stable condition on 08/17/18.                                   Delivering PROVIDER: MODY, VAISHALI                                                            Complications: None  Newborn Data: Live born female  Birth Weight: 7 lb 13.4 oz (3555 g) APGAR: 7, 9  Newborn Delivery   Birth date/time:  08/14/2018 20:26:00 Delivery type:  C-Section, Low Transverse Trial of  labor:  Yes C-section categorization:  Primary     Baby Feeding: Bottle and Breast Disposition:home with mother  Post partum procedures:none  Labs: Lab Results  Component Value Date   WBC 15.1 (H) 08/17/2018   HGB 9.8 (L) 08/17/2018   HCT 29.1 (L) 08/17/2018   MCV 93.3 08/17/2018   PLT 204 08/17/2018   CMP Latest Ref Rng & Units 08/13/2018  Glucose 70 - 99 mg/dL 191(Y)  BUN 6 - 20 mg/dL 8  Creatinine 7.82 - 9.56 mg/dL 2.13  Sodium 086 - 578 mmol/L 131(L)  Potassium 3.5 - 5.1 mmol/L 3.8  Chloride 98 - 111 mmol/L 102  CO2 22 - 32 mmol/L 18(L)  Calcium 8.9 - 10.3 mg/dL 9.2  Total Protein 6.5 - 8.1 g/dL 6.8  Total Bilirubin 0.3 - 1.2 mg/dL 0.3  Alkaline Phos 38 - 126 U/L 110  AST 15 - 41 U/L 18  ALT 0 - 44 U/L 13      Physical Exam @ time of discharge:  Vitals:   08/16/18 0800 08/16/18 1411 08/16/18 2123 08/17/18 0617  BP: 116/76 118/73 117/67 122/83  Pulse:  84 96 94  Resp:  18 18 18   Temp: 98 F (36.7 C) 98 F (36.7 C) 98.3 F (36.8 C) 98 F (36.7 C)  TempSrc: Oral Oral Oral Oral  SpO2:  99% 100% 98%  Weight:      Height:        General: alert, cooperative and no distress Lochia: appropriate Uterine Fundus: firm Perineum: intact Incision: Healing well with no significant drainage Extremities: DVT Evaluation: No evidence of DVT seen on physical exam.   Discharge instructions:  "Baby and Me Booklet" and [facility] Booklet  Discharge Medications:  Allergies as of 08/17/2018      Reactions   Tylenol [acetaminophen] Anaphylaxis      Medication List    STOP taking these medications   labetalol 200 MG tablet Commonly known as:  NORMODYNE     TAKE these medications   ibuprofen 800 MG tablet Commonly known as:  ADVIL,MOTRIN Take 1 tablet (800 mg total) by mouth every 6 (six) hours.   oxyCODONE 5 MG immediate release tablet Commonly known as:  Oxy IR/ROXICODONE Take 1 tablet (5 mg total) by mouth every 4 (four) hours as needed (pain scale 4-7).    prenatal multivitamin Tabs tablet Take 1 tablet by mouth daily at 12 noon.            Discharge Care Instructions  (From admission, onward)         Start     Ordered   08/17/18 0000  Discharge wound care:    Comments:  Leave honeycomb in place for 5 days - remove if get wet in shower. Leave steri-strips in place x 2 weeks. Keep incision clean and dry   08/17/18 0928          Diet: routine diet  Activity: Advance as tolerated. Pelvic rest x 6 weeks.   Follow up:3 days at [facility] for dressing removal and BP recheck    Signed: Marlinda Mikeanya Samarie Pinder CNM, MSN, [facility] 08/17/2018, 9:28 AM

## 2019-12-17 ENCOUNTER — Ambulatory Visit
Admission: EM | Admit: 2019-12-17 | Discharge: 2019-12-17 | Disposition: A | Discharge status: Home / Self Care | Payer: BC Managed Care – PPO | Attending: Emergency Medicine | Admitting: Emergency Medicine

## 2019-12-17 ENCOUNTER — Other Ambulatory Visit: Payer: Self-pay

## 2019-12-17 DIAGNOSIS — H66002 Acute suppurative otitis media without spontaneous rupture of ear drum, left ear: Secondary | ICD-10-CM

## 2019-12-17 MED ORDER — AMOXICILLIN 500 MG PO CAPS: 500.0000 mg | ORAL_CAPSULE | Freq: Two times a day (BID) | ORAL | 0 refills | Status: DC

## 2019-12-17 MED ORDER — IBUPROFEN 800 MG PO TABS: 800.0000 mg | ORAL_TABLET | Freq: Three times a day (TID) | ORAL | 0 refills | Status: AC

## 2019-12-17 NOTE — ED Triage Notes (Signed)
Pt presents to UC w/ c/o left ear ache that radiates to left neck x4-5 days. Pt states she gets frequent ear infections.

## 2019-12-17 NOTE — Discharge Instructions (Addendum)
Rest and drink plenty of fluids Prescribed amoxicillin  Take medication as directed and to completion Use ibuprofen as needed for pain control Follow up with PCP if symptoms persists Return here or go to the ER if you have any new or worsening symptoms fever, chills, nausea, vomiting, worsening symptoms despite medication, etc..Marland Kitchen

## 2019-12-17 NOTE — ED Provider Notes (Signed)
[place]   [id number] 12/17/19 Arrival Time: 3295  CC: EAR PAIN  SUBJECTIVE: History from: patient.  LUMA CLOPPER is a 28 y.o. female who presents with of left ear pain x 4-5 days.  Denies a precipitating event, or trauma.  Does admit to cold symptoms last week.  Patient states the pain is intermittent and throbbing in character.  8/10.  Patient has tried OTC medications without relief.  Symptoms are made worse with lying on opposite side.  Reports similar symptoms in the past with frequent ear infections.  Complains of pain radiating into jaw, and LT sinus as well.    Denies fever, chills, fatigue,  rhinorrhea, ear discharge, sore throat, SOB, wheezing, chest pain, nausea, changes in bowel or bladder habits.    ROS: As per HPI.  All other pertinent ROS negative.     Past Medical History:  Diagnosis Date  . Abscess   . Asthma    childhood  . Mental disorder    Anxiety/depression was taking medications but stopped.    Past Surgical History:  Procedure Laterality Date  . CESAREAN SECTION N/A 08/14/2018   Procedure: CESAREAN SECTION;  Surgeon: Azucena Fallen, MD;  Location: [facility];  Service: Obstetrics;  Laterality: N/A;  . CYST EXCISION     Allergies  Allergen Reactions  . Tylenol [Acetaminophen] Anaphylaxis   No current facility-administered medications on file prior to encounter.   Current Outpatient Medications on File Prior to Encounter  Medication Sig Dispense Refill  . oxyCODONE (OXY IR/ROXICODONE) 5 MG immediate release tablet Take 1 tablet (5 mg total) by mouth every 4 (four) hours as needed (pain scale 4-7). 20 tablet 0  . Prenatal Vit-Fe Fumarate-FA (PRENATAL MULTIVITAMIN) TABS tablet Take 1 tablet by mouth daily at 12 noon.     Social History   Socioeconomic History  . Marital status: Single    Spouse name: Not on file  . Number of children: Not on file  . Years of education: Not on file  . Highest education level: Not on file    Occupational History  . Not on file  Tobacco Use  . Smoking status: Never Smoker  . Smokeless tobacco: Never Used  Substance and Sexual Activity  . Alcohol use: Not Currently    Comment: not in pregnancy  . Drug use: Never  . Sexual activity: Yes  Other Topics Concern  . Not on file  Social History Narrative  . Not on file   Social Determinants of Health   Financial Resource Strain:   . Difficulty of Paying Living Expenses: Not on file  Food Insecurity:   . Worried About Charity fundraiser in the Last Year: Not on file  . Ran Out of Food in the Last Year: Not on file  Transportation Needs:   . Lack of Transportation (Medical): Not on file  . Lack of Transportation (Non-Medical): Not on file  Physical Activity:   . Days of Exercise per Week: Not on file  . Minutes of Exercise per Session: Not on file  Stress:   . Feeling of Stress : Not on file  Social Connections:   . Frequency of Communication with Friends and Family: Not on file  . Frequency of Social Gatherings with Friends and Family: Not on file  . Attends Religious Services: Not on file  . Active Member of Clubs or Organizations: Not on file  . Attends Archivist Meetings: Not on file  . Marital Status: Not on  file  Intimate Partner Violence:   . Fear of Current or Ex-Partner: Not on file  . Emotionally Abused: Not on file  . Physically Abused: Not on file  . Sexually Abused: Not on file   Family History  Problem Relation Age of Onset  . Healthy Mother   . Healthy Father     OBJECTIVE:  Vitals:   12/17/19 1238  BP: 113/79  Pulse: 80  Resp: 16  Temp: 98.8 F (37.1 C)  TempSrc: Oral  SpO2: 97%     General appearance: alert; well-appearing, non-toxic HEENT: Ears: EACs clear, RT TM pearly gray with visible cone of light, without erythema, LT TM mildly erythematous; Eyes: PERRL, EOMI grossly; Nose: no rhinorrhea, nares patent; Throat: oropharynx clear, tonsils not erythematous, without  white tonsillar exudates, uvula midline Neck: supple without LAD Lungs: unlabored respirations, symmetrical air entry; cough: absent; no respiratory distress Heart: regular rate and rhythm. Skin: warm and dry Psychological: alert and cooperative; normal mood and affect  ASSESSMENT & PLAN:  1. Non-recurrent acute suppurative otitis media of left ear without spontaneous rupture of tympanic membrane     Meds ordered this encounter  Medications  . amoxicillin (AMOXIL) 500 MG capsule    Sig: Take 1 capsule (500 mg total) by mouth 2 (two) times daily for 10 days.    Dispense:  20 capsule    Refill:  0    Order Specific Question:   Supervising Provider    Answer:   Eustace Moore [[id number]]  . ibuprofen (ADVIL) 800 MG tablet    Sig: Take 1 tablet (800 mg total) by mouth 3 (three) times daily.    Dispense:  21 tablet    Refill:  0    Order Specific Question:   Supervising Provider    Answer:   Eustace Moore [[id number]]   Rest and drink plenty of fluids Prescribed amoxicillin  Take medication as directed and to completion Use ibuprofen as needed for pain control Follow up with PCP if symptoms persists Return here or go to the ER if you have any new or worsening symptoms fever, chills, nausea, vomiting, worsening symptoms despite medication, etc...   Reviewed expectations re: course of current medical issues. Questions answered. Outlined signs and symptoms indicating need for more acute intervention. Patient verbalized understanding. After Visit Summary given.         Rennis Harding, PA-C 12/17/19 1302

## 2019-12-19 ENCOUNTER — Encounter (HOSPITAL_COMMUNITY): Payer: Self-pay | Admitting: Emergency Medicine

## 2019-12-19 ENCOUNTER — Emergency Department (HOSPITAL_COMMUNITY)
Admission: EM | Admit: 2019-12-19 | Discharge: 2019-12-19 | Disposition: A | Discharge status: Home / Self Care | Payer: Medicaid Other | Attending: Pediatric Emergency Medicine | Admitting: Pediatric Emergency Medicine

## 2019-12-19 ENCOUNTER — Other Ambulatory Visit: Payer: Self-pay

## 2019-12-19 DIAGNOSIS — R21 Rash and other nonspecific skin eruption: Secondary | ICD-10-CM | POA: Insufficient documentation

## 2019-12-19 DIAGNOSIS — H66005 Acute suppurative otitis media without spontaneous rupture of ear drum, recurrent, left ear: Secondary | ICD-10-CM

## 2019-12-19 DIAGNOSIS — H60392 Other infective otitis externa, left ear: Secondary | ICD-10-CM

## 2019-12-19 MED ORDER — AMOXICILLIN-POT CLAVULANATE 875-125 MG PO TABS: 1.0000 | ORAL_TABLET | Freq: Two times a day (BID) | ORAL | 0 refills | Status: DC

## 2019-12-19 MED ORDER — LIDOCAINE HCL (PF) 1 % IJ SOLN
5.0000 mL | Freq: Once | INTRAMUSCULAR | Status: AC
  Administered 2019-12-19: 5 mL via INTRADERMAL
  Filled 2019-12-19: qty 5

## 2019-12-19 MED ORDER — CLINDAMYCIN HCL 300 MG PO CAPS: 300.0000 mg | ORAL_CAPSULE | Freq: Three times a day (TID) | ORAL | 0 refills | Status: AC

## 2019-12-19 MED ORDER — OXYCODONE HCL 5 MG PO TABS
5.0000 mg | ORAL_TABLET | Freq: Once | ORAL | Status: AC
  Administered 2019-12-19: 13:00:00 5 mg via ORAL
  Filled 2019-12-19: qty 1

## 2019-12-19 NOTE — ED Notes (Signed)
ED Provider at bedside. 

## 2019-12-19 NOTE — ED Provider Notes (Signed)
[facility] EMERGENCY DEPARTMENT Provider Note   CSN: [id number] Arrival date & time: 12/19/19  1131     History Chief Complaint  Patient presents with  . Otalgia    Becky Fuller is a 28 y.o. female.  HPI      28yo F with L ear pain for 4-5 days.  Seen 2 days prior and started on amox for ear infection.  Pain worse to face so presents.  No fevers.  No trauma.  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  Ibuprofen 800mg  prior to arrival without improvement.    Past Medical History:  Diagnosis Date  . Abscess   . Asthma    childhood  . Mental disorder    Anxiety/depression was taking medications but stopped.     Patient Active Problem List   Diagnosis Date Noted  . Postpartum care following cesarean delivery (8/24) 08/15/2018  . 1C/S - Indication: FTP 08/14/2018  . Gestational hypertension 08/13/2018    Past Surgical History:  Procedure Laterality Date  . CESAREAN SECTION N/A 08/14/2018   Procedure: CESAREAN SECTION;  Surgeon: 08/16/2018, MD;  Location: [facility] SUIT[facility];  Service: Obstetrics;  Laterality: N/A;  . CYST EXCISION       OB History    Gravida  1   Para  1   Term  1   Preterm  0   AB  0   Living  1     SAB  0   TAB  0   Ectopic  0   Multiple  0   Live Births  1           Family History  Problem Relation Age of Onset  . Healthy Mother   . Healthy Father     Social History   Tobacco Use  . Smoking status: Never Smoker  . Smokeless tobacco: Never Used  Substance Use Topics  . Alcohol use: Not Currently    Comment: not in pregnancy  . Drug use: Never    Home Medications Prior to Admission medications   Medication Sig Start Date End Date Taking? Authorizing Provider  amoxicillin-clavulanate (AUGMENTIN) 875-125 MG tablet Take 1 tablet by mouth every 12 (twelve) hours. 12/19/19   Jahki Witham, 12/21/19, MD  clindamycin (CLEOCIN) 300 MG capsule Take 1 capsule (300 mg  total) by mouth 3 (three) times daily for 7 days. 12/19/19 12/26/19  02/23/20, MD  ibuprofen (ADVIL) 800 MG tablet Take 1 tablet (800 mg total) by mouth 3 (three) times daily. 12/17/19   Wurst, 12/19/19, PA-C  oxyCODONE (OXY IR/ROXICODONE) 5 MG immediate release tablet Take 1 tablet (5 mg total) by mouth every 4 (four) hours as needed (pain scale 4-7). 08/17/18   08/19/18, CNM  Prenatal Vit-Fe Fumarate-FA (PRENATAL MULTIVITAMIN) TABS tablet Take 1 tablet by mouth daily at 12 noon.    [provider]    Allergies    Tylenol [acetaminophen]  Review of Systems   Review of Systems  Constitutional: Positive for activity change. Negative for fever.  HENT: Positive for dental problem, ear pain and sore throat. Negative for ear discharge, trouble swallowing and voice change.   Eyes: Negative for pain.  Gastrointestinal: Negative for abdominal pain, diarrhea and vomiting.  Musculoskeletal: Negative for back pain, neck pain and neck stiffness.  Skin: Positive for rash.  Neurological: Positive for headaches. Negative for facial asymmetry, weakness, light-headedness and numbness.  All other systems reviewed and are negative.  Physical Exam Updated Vital Signs BP (!) 142/82 (BP Location: Left Arm)   Pulse 68   Temp 98.8 F (37.1 C)   Resp 20   Wt 89.8 kg   SpO2 100%   BMI 33.98 kg/m   Physical Exam HENT:     Right Ear: Tympanic membrane, ear canal and external ear normal.     Left Ear: Tenderness present. Tympanic membrane is erythematous and bulging.     Ears:   Eyes:     Extraocular Movements: Extraocular movements intact.     Pupils: Pupils are equal, round, and reactive to light.  Neck:     Vascular: No carotid bruit.  Musculoskeletal:     Cervical back: Normal range of motion. No rigidity or tenderness.  Lymphadenopathy:     Cervical: Cervical adenopathy present.  Skin:    Capillary Refill: Capillary refill takes less than 2 seconds.  Neurological:      General: No focal deficit present.     Mental Status: She is oriented to person, place, and time. Mental status is at baseline.     ED Results / Procedures / Treatments   Labs (all labs ordered are listed, but only abnormal results are displayed) Labs Reviewed - No data to display  EKG None  Radiology No results found.  Procedures .Marland KitchenIncision and Drainage  Date/Time: 12/19/2019 12:37 PM Performed by: Brent Bulla, MD Authorized by: Brent Bulla, MD   Consent:    Consent obtained:  Verbal   Consent given by:  Patient   Risks discussed:  Bleeding, incomplete drainage, infection and pain   Alternatives discussed:  Alternative treatment Location:    Type:  Abscess   Size:  2   Location:  Head   Head location:  L external ear Pre-procedure details:    Skin preparation:  Betadine Anesthesia (see MAR for exact dosages):    Anesthesia method:  Nerve block   Block location:  Pinna   Block needle gauge:  25 G   Block anesthetic:  Lidocaine 1% w/o epi   Block injection procedure:  Anatomic landmarks identified, introduced needle, negative aspiration for blood, incremental injection and anatomic landmarks palpated   Block outcome:  Anesthesia achieved Procedure type:    Complexity:  Complex Procedure details:    Incision types:  Single straight   Incision depth:  Submucosal   Scalpel blade:  11   Wound management:  Probed and deloculated and irrigated with saline   Drainage:  Bloody and purulent   Drainage amount:  Moderate   Wound treatment:  Wound left open   Packing materials:  None Post-procedure details:    Patient tolerance of procedure:  Tolerated well, no immediate complications   (including critical care time)  Medications Ordered in ED Medications  lidocaine (PF) (XYLOCAINE) 1 % injection 5 mL (5 mLs Intradermal Given 12/19/19 1254)  oxyCODONE (Oxy IR/ROXICODONE) immediate release tablet 5 mg (5 mg Oral Given 12/19/19 1250)    ED Course  I have  reviewed the triage vital signs and the nursing notes.  Pertinent labs & imaging results that were available during my care of the patient were reviewed by me and considered in my medical decision making (see chart for details).    MDM Rules/Calculators/A&P                      MDM:  28 y.o. presents with 3 days of symptoms as per above.  The patient's presentation is most consistent  with Acute Otitis Media.  The patient's L TM erythematous and bulging.    The patient is well-appearing and well-hydrated.  The patient's lungs are clear to auscultation bilaterally. Additionally, the patient has a soft/non-tender abdomen and no oropharyngeal exudates.  There are no signs of meningismus.  I see no signs of a Serious Bacterial Infection.  Pain controlled with roxicodone here.  No narcotic doses provided at discharge.    I have a low suspicion for Pneumonia as the patient has not had any cough and is neither tachypneic nor hypoxic on room air.  Additionally, the patient is CTAB.  Abscess to pinna drained as above without complication.  Will treat with clindamycin for recurrent nature and purulent drainage, MRSA.  I believe that the patient is safe for outpatient followup.  The patient was discharged with a prescription for augmentin and clinda.  I provided ED return precautions.  The patient felt safe with this plan.  Final Clinical Impression(s) / ED Diagnoses Final diagnoses:  Recurrent acute suppurative otitis media without spontaneous rupture of left tympanic membrane  Infection of left earlobe    Rx / DC Orders ED Discharge Orders         Ordered    amoxicillin-clavulanate (AUGMENTIN) 875-125 MG tablet  Every 12 hours     12/19/19 1232    clindamycin (CLEOCIN) 300 MG capsule  3 times daily     12/19/19 1232           Charlett Noseeichert, Josey Forcier J, MD 12/19/19 1306

## 2019-12-19 NOTE — ED Triage Notes (Signed)
Pt seen at Urgent Care for left ear infection and started on amoxicillin. Pt says ear pain has gotten worse and radiates to jaw and has sore throat. 800 mg ibuprofen PTA. Pain 7/10.

## 2020-09-10 ENCOUNTER — Other Ambulatory Visit: Payer: Self-pay

## 2020-09-10 ENCOUNTER — Ambulatory Visit
Admission: EM | Admit: 2020-09-10 | Discharge: 2020-09-10 | Disposition: A | Discharge status: Home / Self Care | Payer: Medicaid Other | Attending: Emergency Medicine | Admitting: Emergency Medicine

## 2020-09-10 DIAGNOSIS — H1033 Unspecified acute conjunctivitis, bilateral: Secondary | ICD-10-CM

## 2020-09-10 MED ORDER — POLYMYXIN B-TRIMETHOPRIM 10000-0.1 UNIT/ML-% OP SOLN: 1.0000 [drp] | OPHTHALMIC | 0 refills | Status: AC

## 2020-09-10 NOTE — ED Provider Notes (Signed)
[facility]   [id number] 09/10/20 Arrival Time: 1106  CC: Red eye  SUBJECTIVE:  Becky Fuller is a 29 y.o. female who presents to the urgent care with a complaint of eye redness that began  3 to 4 days ago.  Denies a precipitating event, trauma, or close contacts with similar symptoms.  Has tried OTC eye drops without relief.  Denies aggravating factors.  Denies similar symptoms in the past.  Denies fever, chills, nausea, vomiting, eye pain, painful eye movements, halos, discharge, itching, vision changes, double vision, FB sensation, periorbital erythema.     Denies contact lens use.    ROS: As per HPI.  All other pertinent ROS negative.     Past Medical History:  Diagnosis Date  . Abscess   . Asthma    childhood  . Mental disorder    Anxiety/depression was taking medications but stopped.    Past Surgical History:  Procedure Laterality Date  . CESAREAN SECTION N/A 08/14/2018   Procedure: CESAREAN SECTION;  Surgeon: Shea Evans, MD;  Location: [facility] BIRTHING SUITES;  Service: Obstetrics;  Laterality: N/A;  . CYST EXCISION     Allergies  Allergen Reactions  . Tylenol [Acetaminophen] Anaphylaxis   No current facility-administered medications on file prior to encounter.   Current Outpatient Medications on File Prior to Encounter  Medication Sig Dispense Refill  . amoxicillin-clavulanate (AUGMENTIN) 875-125 MG tablet Take 1 tablet by mouth every 12 (twelve) hours. 14 tablet 0  . ibuprofen (ADVIL) 800 MG tablet Take 1 tablet (800 mg total) by mouth 3 (three) times daily. 21 tablet 0  . oxyCODONE (OXY IR/ROXICODONE) 5 MG immediate release tablet Take 1 tablet (5 mg total) by mouth every 4 (four) hours as needed (pain scale 4-7). 20 tablet 0  . Prenatal Vit-Fe Fumarate-FA (PRENATAL MULTIVITAMIN) TABS tablet Take 1 tablet by mouth daily at 12 noon.     Social History   Socioeconomic History  . Marital status: Single    Spouse name: Not on file  . Number of  children: Not on file  . Years of education: Not on file  . Highest education level: Not on file  Occupational History  . Not on file  Tobacco Use  . Smoking status: Never Smoker  . Smokeless tobacco: Never Used  Substance and Sexual Activity  . Alcohol use: Not Currently    Comment: not in pregnancy  . Drug use: Never  . Sexual activity: Yes  Other Topics Concern  . Not on file  Social History Narrative  . Not on file   Social Determinants of Health   Financial Resource Strain:   . Difficulty of Paying Living Expenses: Not on file  Food Insecurity:   . Worried About Programme researcher, broadcasting/film/video in the Last Year: Not on file  . Ran Out of Food in the Last Year: Not on file  Transportation Needs:   . Lack of Transportation (Medical): Not on file  . Lack of Transportation (Non-Medical): Not on file  Physical Activity:   . Days of Exercise per Week: Not on file  . Minutes of Exercise per Session: Not on file  Stress:   . Feeling of Stress : Not on file  Social Connections:   . Frequency of Communication with Friends and Family: Not on file  . Frequency of Social Gatherings with Friends and Family: Not on file  . Attends Religious Services: Not on file  . Active Member of Clubs or Organizations: Not on file  .  Attends Banker Meetings: Not on file  . Marital Status: Not on file  Intimate Partner Violence:   . Fear of Current or Ex-Partner: Not on file  . Emotionally Abused: Not on file  . Physically Abused: Not on file  . Sexually Abused: Not on file   Family History  Problem Relation Age of Onset  . Healthy Mother   . Healthy Father     OBJECTIVE:    Visual Acuity  Right Eye Distance:   Left Eye Distance:   Bilateral Distance:    Right Eye Near:   Left Eye Near:    Bilateral Near:      Vitals:   09/10/20 1150  BP: 117/79  Pulse: 65  Resp: 16  Temp: 98.3 F (36.8 C)  SpO2: 99%    Physical Exam Vitals and nursing note reviewed.    Constitutional:      General: She is not in acute distress.    Appearance: Normal appearance. She is normal weight. She is not ill-appearing, toxic-appearing or diaphoretic.  HENT:     Head: Normocephalic.  Eyes:     General: Lids are normal. Lids are everted, no foreign bodies appreciated. Vision grossly intact. Gaze aligned appropriately.        Right eye: Discharge present. No foreign body or hordeolum.        Left eye: Discharge present.No foreign body or hordeolum.     Comments: Bilateral eye redness and irritation  Cardiovascular:     Rate and Rhythm: Normal rate and regular rhythm.     Pulses: Normal pulses.     Heart sounds: Normal heart sounds. No murmur heard.  No friction rub. No gallop.   Pulmonary:     Effort: Pulmonary effort is normal. No respiratory distress.     Breath sounds: Normal breath sounds. No stridor. No wheezing, rhonchi or rales.  Chest:     Chest wall: No tenderness.  Neurological:     Mental Status: She is alert and oriented to person, place, and time.      ASSESSMENT & PLAN:  1. Acute bacterial conjunctivitis of both eyes     Meds ordered this encounter  Medications  . trimethoprim-polymyxin b (POLYTRIM) ophthalmic solution    Sig: Place 1 drop into both eyes every 4 (four) hours for 10 days.    Dispense:  10 mL    Refill:  0   Discharge instructions  Use eye drops as prescribed and to completion Dispose of old contacts and wear glasses until you have finished course of antibiotic eye drops Wash pillow cases, wash hands regularly with soap and water, avoid touching your face and eyes, wash door handles, light switches, remotes and other objects you frequently touch Return or follow up with PCP if symptoms persists such as fever, chills, redness, swelling, eye pain, painful eye movements, vision changes, etc...   Reviewed expectations re: course of current medical issues. Questions answered. Outlined signs and symptoms indicating need for  more acute intervention. Patient verbalized understanding. After Visit Summary given.   Durward Parcel, FNP 09/10/20 1234

## 2020-09-10 NOTE — Discharge Instructions (Addendum)
Use eye drops as prescribed and to completion Dispose of old contacts and wear glasses until you have finished course of antibiotic eye drops Wash pillow cases, wash hands regularly with soap and water, avoid touching your face and eyes, wash door handles, light switches, remotes and other objects you frequently touch Return or follow up with PCP if symptoms persists such as fever, chills, redness, swelling, eye pain, painful eye movements, vision changes, etc... 

## 2020-09-10 NOTE — ED Triage Notes (Signed)
Pt presents with eye redness and irritation in both eyes for past few days

## 2020-10-08 ENCOUNTER — Encounter: Payer: Self-pay | Admitting: Emergency Medicine

## 2020-10-08 ENCOUNTER — Ambulatory Visit
Admission: EM | Admit: 2020-10-08 | Discharge: 2020-10-08 | Disposition: A | Discharge status: Home / Self Care | Payer: BC Managed Care – PPO | Attending: Emergency Medicine | Admitting: Emergency Medicine

## 2020-10-08 ENCOUNTER — Other Ambulatory Visit: Payer: Self-pay

## 2020-10-08 DIAGNOSIS — J069 Acute upper respiratory infection, unspecified: Secondary | ICD-10-CM

## 2020-10-08 DIAGNOSIS — Z1152 Encounter for screening for COVID-19: Secondary | ICD-10-CM

## 2020-10-08 LAB — POCT INFLUENZA A/B
Influenza A, POC: NEGATIVE
Influenza B, POC: NEGATIVE

## 2020-10-08 MED ORDER — FLUTICASONE PROPIONATE 50 MCG/ACT NA SUSP: 1.0000 | Freq: Every day | NASAL | 0 refills | Status: DC

## 2020-10-08 MED ORDER — BENZONATATE 100 MG PO CAPS: 100.0000 mg | ORAL_CAPSULE | Freq: Three times a day (TID) | ORAL | 0 refills | Status: DC

## 2020-10-08 MED ORDER — CETIRIZINE HCL 10 MG PO TABS: 10.0000 mg | ORAL_TABLET | Freq: Every day | ORAL | 0 refills | Status: DC

## 2020-10-08 MED ORDER — DEXAMETHASONE 4 MG PO TABS: 4.0000 mg | ORAL_TABLET | Freq: Every day | ORAL | 0 refills | Status: AC

## 2020-10-08 NOTE — ED Provider Notes (Signed)
[facility]   [id number] 10/08/20 Arrival Time: 1723   CC: COVID symptoms  SUBJECTIVE: History from: patient.  Becky Fuller is a 29 y.o. female who presents who presented to the urgent care for complaint of chills, fever, cough, nasal, headache, and body ache for the past 1 day.  Denies sick exposure to COVID, flu or strep.  Denies recent travel.  Has tried OTC medication without relief.  Denies aggravating factors.  Denies previous symptoms in the past.   Denies , fatigue, sinus pain, rhinorrhea, sore throat, SOB, wheezing, chest pain, nausea, changes in bowel or bladder habits.       ROS: As per HPI.  All other pertinent ROS negative.     Past Medical History:  Diagnosis Date  . Abscess   . Asthma    childhood  . Mental disorder    Anxiety/depression was taking medications but stopped.    Past Surgical History:  Procedure Laterality Date  . CESAREAN SECTION N/A 08/14/2018   Procedure: CESAREAN SECTION;  Surgeon: Shea Evans, MD;  Location: [facility] BIRTHING SUITES;  Service: Obstetrics;  Laterality: N/A;  . CYST EXCISION     Allergies  Allergen Reactions  . Tylenol [Acetaminophen] Anaphylaxis   No current facility-administered medications on file prior to encounter.   Current Outpatient Medications on File Prior to Encounter  Medication Sig Dispense Refill  . amoxicillin-clavulanate (AUGMENTIN) 875-125 MG tablet Take 1 tablet by mouth every 12 (twelve) hours. 14 tablet 0  . ibuprofen (ADVIL) 800 MG tablet Take 1 tablet (800 mg total) by mouth 3 (three) times daily. 21 tablet 0  . oxyCODONE (OXY IR/ROXICODONE) 5 MG immediate release tablet Take 1 tablet (5 mg total) by mouth every 4 (four) hours as needed (pain scale 4-7). 20 tablet 0  . Prenatal Vit-Fe Fumarate-FA (PRENATAL MULTIVITAMIN) TABS tablet Take 1 tablet by mouth daily at 12 noon.     Social History   Socioeconomic History  . Marital status: Single    Spouse name: Not on file  . Number of  children: Not on file  . Years of education: Not on file  . Highest education level: Not on file  Occupational History  . Not on file  Tobacco Use  . Smoking status: Never Smoker  . Smokeless tobacco: Never Used  Substance and Sexual Activity  . Alcohol use: Not Currently    Comment: not in pregnancy  . Drug use: Never  . Sexual activity: Yes  Other Topics Concern  . Not on file  Social History Narrative  . Not on file   Social Determinants of Health   Financial Resource Strain:   . Difficulty of Paying Living Expenses: Not on file  Food Insecurity:   . Worried About Programme researcher, broadcasting/film/video in the Last Year: Not on file  . Ran Out of Food in the Last Year: Not on file  Transportation Needs:   . Lack of Transportation (Medical): Not on file  . Lack of Transportation (Non-Medical): Not on file  Physical Activity:   . Days of Exercise per Week: Not on file  . Minutes of Exercise per Session: Not on file  Stress:   . Feeling of Stress : Not on file  Social Connections:   . Frequency of Communication with Friends and Family: Not on file  . Frequency of Social Gatherings with Friends and Family: Not on file  . Attends Religious Services: Not on file  . Active Member of Clubs or Organizations: Not on  file  . Attends Banker Meetings: Not on file  . Marital Status: Not on file  Intimate Partner Violence:   . Fear of Current or Ex-Partner: Not on file  . Emotionally Abused: Not on file  . Physically Abused: Not on file  . Sexually Abused: Not on file   Family History  Problem Relation Age of Onset  . Healthy Mother   . Healthy Father     OBJECTIVE:  Vitals:   10/08/20 1833  BP: 120/79  Pulse: 97  Resp: 16  Temp: (!) 100.9 F (38.3 C)  SpO2: 99%     General appearance: alert; appears fatigued, but nontoxic; speaking in full sentences and tolerating own secretions HEENT: NCAT; Ears: EACs clear, TMs pearly gray; Eyes: PERRL.  EOM grossly intact. Sinuses:  nontender; Nose: nares patent without rhinorrhea, Throat: oropharynx clear, tonsils non erythematous or enlarged, uvula midline  Neck: supple without LAD Lungs: unlabored respirations, symmetrical air entry; cough: moderate; no respiratory distress; CTAB Heart: regular rate and rhythm.  Radial pulses 2+ symmetrical bilaterally Skin: warm and dry Psychological: alert and cooperative; normal mood and affect  LABS:  Results for orders placed or performed during the hospital encounter of 10/08/20 (from the past 24 hour(s))  POCT Influenza A/B     Status: None   Collection Time: 10/08/20  7:19 PM  Result Value Ref Range   Influenza A, POC Negative Negative   Influenza B, POC Negative Negative     ASSESSMENT & PLAN:  1. Viral URI with cough   2. Encounter for screening for COVID-19     Meds ordered this encounter  Medications  . cetirizine (ZYRTEC ALLERGY) 10 MG tablet    Sig: Take 1 tablet (10 mg total) by mouth daily.    Dispense:  30 tablet    Refill:  0  . fluticasone (FLONASE) 50 MCG/ACT nasal spray    Sig: Place 1 spray into both nostrils daily for 14 days.    Dispense:  16 g    Refill:  0  . benzonatate (TESSALON) 100 MG capsule    Sig: Take 1 capsule (100 mg total) by mouth every 8 (eight) hours.    Dispense:  30 capsule    Refill:  0  . dexamethasone (DECADRON) 4 MG tablet    Sig: Take 1 tablet (4 mg total) by mouth daily for 7 days.    Dispense:  7 tablet    Refill:  0    Discharge instructions  Influenza A/B test were negative  COVID testing ordered.  It will take between 2-7 days for test results.  Someone will contact you regarding abnormal results.    In the meantime: You should remain isolated in your home for 10 days from symptom onset AND greater than 24 hours after symptoms resolution (absence of fever without the use of fever-reducing medication and improvement in respiratory symptoms), whichever is longer Get plenty of rest and push fluids Tessalon  Perles prescribed for cough Zyrtec for nasal congestion, runny nose, and/or sore throat Flonase for nasal congestion and runny nose  Decadron was prescribed Use medications daily for symptom relief Use OTC medications like ibuprofen or tylenol as needed fever or pain Call or go to the ED if you have any new or worsening symptoms such as fever, worsening cough, shortness of breath, chest tightness, chest pain, turning blue, changes in mental status, etc...   Reviewed expectations re: course of current medical issues. Questions answered. Outlined signs and symptoms  indicating need for more acute intervention. Patient verbalized understanding. After Visit Summary given.         Durward Parcel, FNP 10/08/20 1927

## 2020-10-08 NOTE — Discharge Instructions (Signed)
Influenza A/B test were negative  COVID testing ordered.  It will take between 2-7 days for test results.  Someone will contact you regarding abnormal results.    In the meantime: You should remain isolated in your home for 10 days from symptom onset AND greater than 24 hours after symptoms resolution (absence of fever without the use of fever-reducing medication and improvement in respiratory symptoms), whichever is longer Get plenty of rest and push fluids Tessalon Perles prescribed for cough Zyrtec for nasal congestion, runny nose, and/or sore throat Flonase for nasal congestion and runny nose  Decadron was prescribed Use medications daily for symptom relief Use OTC medications like ibuprofen or tylenol as needed fever or pain Call or go to the ED if you have any new or worsening symptoms such as fever, worsening cough, shortness of breath, chest tightness, chest pain, turning blue, changes in mental status, etc..Marland Kitchen

## 2020-10-08 NOTE — ED Triage Notes (Signed)
Patient c/o body aches, slight cough, headache, fever, chills since last night

## 2020-10-09 LAB — SARS-COV-2, NAA 2 DAY TAT

## 2020-10-09 LAB — NOVEL CORONAVIRUS, NAA: SARS-CoV-2, NAA: DETECTED — AB

## 2023-11-05 ENCOUNTER — Ambulatory Visit (HOSPITAL_BASED_OUTPATIENT_CLINIC_OR_DEPARTMENT_OTHER)
Admission: RE | Admit: 2023-11-05 | Discharge: 2023-11-05 | Disposition: A | Discharge status: Home / Self Care | Payer: BC Managed Care – PPO | Source: Ambulatory Visit | Attending: Urgent Care | Admitting: Urgent Care

## 2023-11-05 ENCOUNTER — Ambulatory Visit: Payer: BC Managed Care – PPO | Admitting: Urgent Care

## 2023-11-05 VITALS — BP 114/80 | HR 71 | Temp 98.1°F | Ht 64.0 in | Wt 196.8 lb

## 2023-11-05 DIAGNOSIS — R221 Localized swelling, mass and lump, neck: Secondary | ICD-10-CM | POA: Diagnosis present

## 2023-11-05 DIAGNOSIS — H60542 Acute eczematoid otitis externa, left ear: Secondary | ICD-10-CM

## 2023-11-05 DIAGNOSIS — Z8349 Family history of other endocrine, nutritional and metabolic diseases: Secondary | ICD-10-CM | POA: Diagnosis not present

## 2023-11-05 DIAGNOSIS — Z131 Encounter for screening for diabetes mellitus: Secondary | ICD-10-CM

## 2023-11-05 DIAGNOSIS — Z13220 Encounter for screening for lipoid disorders: Secondary | ICD-10-CM

## 2023-11-05 MED ORDER — FLUOCINOLONE ACETONIDE 0.01 % OT OIL: 5.0000 [drp] | TOPICAL_OIL | Freq: Two times a day (BID) | OTIC | 0 refills | Status: AC

## 2023-11-05 NOTE — Progress Notes (Signed)
New Patient Office Visit  Subjective:  Patient ID: Becky Fuller, female    DOB: Nov 25, 1991  Age: 32 y.o. MRN: [id number]  CC:  Chief Complaint  Patient presents with   Establish Care    New pt est care. She also has a knot on the left side of her neck that's been there for 3 weeks.    HPI Becky Fuller presents to establish care  Pleasant 32yo female presents today to establish care. She reports no routine healthcare since the birth of her youngest child 5 years ago. She is otherwise healthy and takes no Rx medications. She states that three weeks ago she was seen at a local urgent care and treated with antibiotics for an ear infection (chart review reveals pt was prescribed Augmentin, tessalon and flonase). Pt states that she started having some swelling in her neck at that time, but assumed it was lymph nodes related to her ear infection. However, she has since completed the antibiotics and the swelling persists. Pt states the area of her neck has gotten much larger in size. It is not painful. She does feel a deep pressure on the left however when she rotates her head to the right. She denies fever, body aches. Additionally she denies night sweats, bone pain, pruritus, generalized lymphadenopathy, GI sx or weight loss. She has no HA, ear pain, sore throat, sinus pressure. She has been picking at an area on her external L ear. Pt denies having a cat at home. Pt reports family hx of "neck mass" in dad, and thyroid issues in mother.   Outpatient Encounter Medications as of 11/05/2023  Medication Sig   Fluocinolone Acetonide 0.01 % OIL Place 5 drops in ear(s) in the morning and at bedtime for 7 days.   cetirizine (ZYRTEC ALLERGY) 10 MG tablet Take 1 tablet (10 mg total) by mouth daily. (Patient not taking: Reported on 11/05/2023)   ibuprofen (ADVIL) 800 MG tablet Take 1 tablet (800 mg total) by mouth 3 (three) times daily. (Patient not taking: Reported on 11/05/2023)   oxyCODONE (OXY  IR/ROXICODONE) 5 MG immediate release tablet Take 1 tablet (5 mg total) by mouth every 4 (four) hours as needed (pain scale 4-7). (Patient not taking: Reported on 11/05/2023)   Prenatal Vit-Fe Fumarate-FA (PRENATAL MULTIVITAMIN) TABS tablet Take 1 tablet by mouth daily at 12 noon. (Patient not taking: Reported on 11/05/2023)   [DISCONTINUED] amoxicillin-clavulanate (AUGMENTIN) 875-125 MG tablet Take 1 tablet by mouth every 12 (twelve) hours.   [DISCONTINUED] benzonatate (TESSALON) 100 MG capsule Take 1 capsule (100 mg total) by mouth every 8 (eight) hours.   [DISCONTINUED] fluticasone (FLONASE) 50 MCG/ACT nasal spray Place 1 spray into both nostrils daily for 14 days.   No facility-administered encounter medications on file as of 11/05/2023.    Past Medical History:  Diagnosis Date   Abscess    Allergy    When i was a small child   Anemia    When inwas a teenager   Anxiety    Same as depression. Same appointment   Asthma    childhood   Depression 2015   Saidnotnwas due to stress from work.   Hypertension    During and after pregnancy   Mental disorder    Anxiety/depression was taking medications but stopped.     Past Surgical History:  Procedure Laterality Date   CESAREAN SECTION N/A 08/14/2018   Procedure: CESAREAN SECTION;  Surgeon: Shea Evans, MD;  Location: [facility] BIRTHING SUITES;  Service:  Obstetrics;  Laterality: N/A;   CYST EXCISION      Family History  Problem Relation Age of Onset   Healthy Mother    Arthritis Mother    COPD Mother    Healthy Father    ADD / ADHD Father    COPD Father    Stroke Maternal Grandfather    Arthritis Maternal Grandmother    Hypertension Maternal Grandmother     Social History   Socioeconomic History   Marital status: Single    Spouse name: Not on file   Number of children: Not on file   Years of education: Not on file   Highest education level: Bachelor's degree (e.g., BA, AB, BS)  Occupational History   Not on file   Tobacco Use   Smoking status: Never   Smokeless tobacco: Never  Substance and Sexual Activity   Alcohol use: Not Currently    Comment: not in pregnancy   Drug use: Never   Sexual activity: Yes    Birth control/protection: None  Other Topics Concern   Not on file  Social History Narrative   Not on file   Social Determinants of Health   Financial Resource Strain: Medium Risk (11/05/2023)   Overall Financial Resource Strain (CARDIA)    Difficulty of Paying Living Expenses: Somewhat hard  Food Insecurity: Food Insecurity Present (11/05/2023)   Hunger Vital Sign    Worried About Running Out of Food in the Last Year: Sometimes true    Ran Out of Food in the Last Year: Never true  Transportation Needs: No Transportation Needs (11/05/2023)   PRAPARE - Administrator, Civil Service (Medical): No    Lack of Transportation (Non-Medical): No  Physical Activity: Sufficiently Active (11/05/2023)   Exercise Vital Sign    Days of Exercise per Week: 3 days    Minutes of Exercise per Session: 130 min  Stress: Stress Concern Present (11/05/2023)   [employer] of Occupational Health - Occupational Stress Questionnaire    Feeling of Stress : To some extent  Social Connections: Socially Isolated (11/05/2023)   Social Connection and Isolation Panel [NHANES]    Frequency of Communication with Friends and Family: Once a week    Frequency of Social Gatherings with Friends and Family: Never    Attends Religious Services: Never    Database administrator or Organizations: No    Attends Engineer, structural: Not on file    Marital Status: Living with partner  Intimate Partner Violence: Not At Risk (08/13/2018)   Humiliation, Afraid, Rape, and Kick questionnaire    Fear of Current or Ex-Partner: No    Emotionally Abused: No    Physically Abused: No    Sexually Abused: No    ROS: as noted in HPI  Objective:  BP 114/80   Pulse 71   Temp 98.1 F (36.7 C) (Oral)   Ht  5\' 4"  (1.626 m)   Wt 196 lb 12.8 oz (89.3 kg)   SpO2 97%   BMI 33.78 kg/m   Physical Exam Vitals and nursing note reviewed.  Constitutional:      General: She is not in acute distress.    Appearance: Normal appearance. She is not ill-appearing, toxic-appearing or diaphoretic.  HENT:     Head: Normocephalic and atraumatic.     Right Ear: Tympanic membrane, ear canal and external ear normal. No drainage or swelling. No middle ear effusion. There is no impacted cerumen. No mastoid tenderness. Tympanic membrane is not  injected, scarred, perforated or erythematous.     Left Ear: Tympanic membrane and external ear normal. No drainage or swelling.  No middle ear effusion. There is no impacted cerumen. No mastoid tenderness. Tympanic membrane is not injected, scarred, perforated or erythematous.     Ears:     Comments: Lichenified skin with superficial scab noted to L EAC, without signs of infection    Nose: Nose normal.     Mouth/Throat:     Mouth: Mucous membranes are moist.     Pharynx: Oropharynx is clear. No oropharyngeal exudate or posterior oropharyngeal erythema.  Eyes:     General: No scleral icterus.       Right eye: No discharge.        Left eye: No discharge.     Extraocular Movements: Extraocular movements intact.     Pupils: Pupils are equal, round, and reactive to light.  Neck:     Thyroid: No thyroid mass, thyromegaly or thyroid tenderness.     Trachea: Trachea normal. No tracheal tenderness or abnormal tracheal secretions.     Meningeal: Brudzinski's sign and Kernig's sign absent.      Comments: NO supraclavicular or axillary lymphadenopathy Cardiovascular:     Rate and Rhythm: Normal rate and regular rhythm.     Pulses: Normal pulses.     Heart sounds: No murmur heard. Pulmonary:     Effort: Pulmonary effort is normal. No respiratory distress.     Breath sounds: Normal breath sounds. No stridor. No wheezing or rhonchi.  Musculoskeletal:     Cervical back: Full  passive range of motion without pain, normal range of motion and neck supple. No rigidity, torticollis or tenderness. No pain with movement or muscular tenderness. Normal range of motion.     Right lower leg: No edema.     Left lower leg: No edema.  Lymphadenopathy:     Cervical: No cervical adenopathy.  Skin:    General: Skin is warm and dry.     Coloration: Skin is not jaundiced.     Findings: No bruising, erythema or rash.  Neurological:     General: No focal deficit present.     Mental Status: She is alert and oriented to person, place, and time.     Sensory: No sensory deficit.     Motor: No weakness.  Psychiatric:        Mood and Affect: Mood normal.        Behavior: Behavior normal.     Assessment & Plan:  Mass of left side of neck Assessment & Plan: Non-tender. Does not seem to be a lymph node. No additional lymphadenopathy noted. (+) fam hx of neck mass. Will obtain labs and [place] today. Pt without B-symptoms. No ear infection noted. VSS  Orders: -     CBC with Differential/Platelet -     COMPLETE METABOLIC PANEL WITH GFR -     TSH Rfx on Abnormal to Free T4 -     US SOFT TISSUE HEAD & NECK (NON-THYROID); Future  Family history of thyroid disease in mother -     TSH Rfx on Abnormal to Free T4 -     Thyroid Peroxidase Antibodies (TPO) (REFL)  Lipid screening -     Lipid panel  Diabetes mellitus screening -     Hemoglobin A1c  Dermatitis of left ear canal Assessment & Plan: No active infection, unlikely to be related to mass noted on L. Will do trial of BID fluocinolone otic oil.  Orders: -     Fluocinolone Acetonide; Place 5 drops in ear(s) in the morning and at bedtime for 7 days.  Dispense: 20 mL; Refill: 0    Return in about 4 weeks (around 12/03/2023) for lab and ultrasound follow up.   Maretta Bees, PA

## 2023-11-05 NOTE — Assessment & Plan Note (Signed)
Non-tender. Does not seem to be a lymph node. No additional lymphadenopathy noted. (+) fam hx of neck mass. Will obtain labs and [place] today. Pt without B-symptoms. No ear infection noted. VSS

## 2023-11-05 NOTE — Patient Instructions (Addendum)
Nice to meet you today!  We drew labs and will contact you with the results. Please call [facility] to obtain your neck ultrasound. [phone]   We will follow up in office once test results have been obtained.  Regarding the sores on your ear canal, please apply fluocinolone otic oil to the affected areas. Apply 5 drops twice daily to the L ear only, up to 14 days consecutively, but usually 7 days is sufficient.

## 2023-11-05 NOTE — Assessment & Plan Note (Signed)
No active infection, unlikely to be related to mass noted on L. Will do trial of BID fluocinolone otic oil.

## 2023-11-06 LAB — LIPID PANEL
Cholesterol: 177 mg/dL (ref 0–200)
HDL: 32.7 mg/dL — ABNORMAL LOW (ref >39.00)
LDL Cholesterol: 111 mg/dL — ABNORMAL HIGH (ref 0–99)
NonHDL: 143.84
Total CHOL/HDL Ratio: 5
Triglycerides: 163 mg/dL — ABNORMAL HIGH (ref 0.0–149.0)
VLDL: 32.6 mg/dL (ref 0.0–40.0)

## 2023-11-06 LAB — COMPLETE METABOLIC PANEL WITH GFR
AG Ratio: 1.5 (calc) (ref 1.0–2.5)
ALT: 29 U/L (ref 6–29)
AST: 18 U/L (ref 10–30)
Albumin: 4.6 g/dL (ref 3.6–5.1)
Alkaline phosphatase (APISO): 67 U/L (ref 31–125)
BUN: 13 mg/dL (ref 7–25)
CO2: 28 mmol/L (ref 20–32)
Calcium: 9.7 mg/dL (ref 8.6–10.2)
Chloride: 101 mmol/L (ref 98–110)
Creat: 0.78 mg/dL (ref 0.50–0.97)
Globulin: 3.1 g/dL (ref 1.9–3.7)
Glucose, Bld: 83 mg/dL (ref 65–99)
Potassium: 4.1 mmol/L (ref 3.5–5.3)
Sodium: 136 mmol/L (ref 135–146)
Total Bilirubin: 0.4 mg/dL (ref 0.2–1.2)
Total Protein: 7.7 g/dL (ref 6.1–8.1)
eGFR: 103 mL/min/{1.73_m2} (ref >=60)

## 2023-11-06 LAB — CBC WITH DIFFERENTIAL/PLATELET
Basophils Absolute: 0.1 10*3/uL (ref 0.0–0.1)
Basophils Relative: 1.1 % (ref 0.0–3.0)
Eosinophils Absolute: 0.1 10*3/uL (ref 0.0–0.7)
Eosinophils Relative: 1.8 % (ref 0.0–5.0)
HCT: 40.5 % (ref 36.0–46.0)
Hemoglobin: 13.3 g/dL (ref 12.0–15.0)
Lymphocytes Relative: 32.5 % (ref 12.0–46.0)
Lymphs Abs: 1.6 10*3/uL (ref 0.7–4.0)
MCHC: 32.8 g/dL (ref 30.0–36.0)
MCV: 92.7 fL (ref 78.0–100.0)
Monocytes Absolute: 0.5 10*3/uL (ref 0.1–1.0)
Monocytes Relative: 9.5 % (ref 3.0–12.0)
Neutro Abs: 2.6 10*3/uL (ref 1.4–7.7)
Neutrophils Relative %: 55.1 % (ref 43.0–77.0)
Platelets: 205 10*3/uL (ref 150.0–400.0)
RBC: 4.37 Mil/uL (ref 3.87–5.11)
RDW: 12.9 % (ref 11.5–15.5)
WBC: 4.8 10*3/uL (ref 4.0–10.5)

## 2023-11-06 LAB — THYROID PEROXIDASE ANTIBODIES (TPO) (REFL): Thyroperoxidase Ab SerPl-aCnc: <1 [IU]/mL (ref <9)

## 2023-11-06 LAB — HEMOGLOBIN A1C: Hgb A1c MFr Bld: 5.4 % (ref 4.6–6.5)

## 2023-11-09 ENCOUNTER — Telehealth: Payer: Self-pay | Admitting: Urgent Care

## 2023-11-09 DIAGNOSIS — R590 Localized enlarged lymph nodes: Secondary | ICD-10-CM

## 2023-11-09 MED ORDER — AZITHROMYCIN 250 MG PO TABS: ORAL_TABLET | ORAL | 0 refills | Status: AC

## 2023-11-09 NOTE — Telephone Encounter (Signed)
PT to be contacted by staff re: lymph node swelling on L. Will do zpack abx, and monitor response.

## 2023-11-18 ENCOUNTER — Encounter: Payer: Self-pay | Admitting: Urgent Care

## 2023-11-18 ENCOUNTER — Ambulatory Visit: Payer: BC Managed Care – PPO | Admitting: Urgent Care

## 2023-11-18 VITALS — BP 119/85 | HR 88 | Wt 197.2 lb

## 2023-11-18 DIAGNOSIS — E782 Mixed hyperlipidemia: Secondary | ICD-10-CM | POA: Insufficient documentation

## 2023-11-18 DIAGNOSIS — R590 Localized enlarged lymph nodes: Secondary | ICD-10-CM | POA: Insufficient documentation

## 2023-11-18 DIAGNOSIS — H60542 Acute eczematoid otitis externa, left ear: Secondary | ICD-10-CM | POA: Diagnosis not present

## 2023-11-18 HISTORY — DX: Mixed hyperlipidemia: E78.2

## 2023-11-18 NOTE — Progress Notes (Signed)
Established Patient Office Visit  Subjective:  Patient ID: Becky Fuller, female    DOB: 04-29-1991  Age: 32 y.o. MRN: [id number]  Chief Complaint  Patient presents with   Follow-up    Recheck Lymph Nodes; Pt had [place] and was told there was no issues. Pt started to have sinus infection symptoms again but is feeling better. Minimal swelling.     Pleasant 32yo female presents today to recheck her L neck swelling. Was seen on 11/05/23 due to the painless swelling. Pt had just completed a course of amoxicillin for a L-sided ear infection. Pt completed an US of the neck soft tissue that same day. Report as follows: CLINICAL DATA:  Left neck palpable adenopathy, history of bilateral ear infection 1 month ago   EXAM: ULTRASOUND OF HEAD/NECK SOFT TISSUES   TECHNIQUE: Ultrasound examination of the head and neck soft tissues was performed in the area of clinical concern.   COMPARISON:  None Available.   FINDINGS: Ultrasound performed of the left lateral neck area concern/palpable abnormality below the ear and posterior to the mandible.   This correlates with enlarged superficial cervical lymph nodes with markedly thickened hypoechoic cortex but preserved fatty vascular hila. At least 3 adjacent enlarged lymph nodes are noted in the area, largest measuring 3.4 x 0.8 x 1.5 cm. The other 2 adjacent lymph nodes measure 2.6 x 1.0 x 2.1 cm and 2.4 x 1.2 x 1.7 cm. By ultrasound, these are nonspecific and may be related to infectious/inflammatory or reactive process. Lymph nodes remain indeterminate and lymphoproliferative disorder not excluded.   No other regional soft tissue abnormality, cyst, or fluid collection.   IMPRESSION: Left lateral neck palpable abnormality correlates with indeterminate superficial cervical adenopathy as above.   Pt tells me today that shortly after her visit on the 14th, she started developing sinusitis like symptoms. In an attempt to determine if the  lymphadenopathy was infectious/ reactive, azithromycin was called in for patient on 11/09/23. Pt states she took this as prescribed and completed the pack on 11/13/23. She reports feeling that this has nearly resolved the swelling on the L side. She also states that her sinusitis symptoms have resolved as well. She reports no new concerns today and feels at baseline health.     ROS: as noted in HPI  Objective:     BP 119/85   Pulse 88   Wt 197 lb 3.2 oz (89.4 kg)   SpO2 98%   BMI 33.85 kg/m  BP Readings from Last 3 Encounters:  11/18/23 119/85  11/05/23 114/80  10/08/20 120/79   Wt Readings from Last 3 Encounters:  11/18/23 197 lb 3.2 oz (89.4 kg)  11/05/23 196 lb 12.8 oz (89.3 kg)  12/19/19 197 lb 15.6 oz (89.8 kg)      Physical Exam Vitals and nursing note reviewed.  Constitutional:      General: She is not in acute distress.    Appearance: Normal appearance. She is not ill-appearing, toxic-appearing or diaphoretic.  HENT:     Head: Normocephalic and atraumatic.     Right Ear: Tympanic membrane, ear canal and external ear normal. No drainage or swelling. No middle ear effusion. There is no impacted cerumen. No mastoid tenderness. Tympanic membrane is not injected, scarred, perforated or erythematous.     Left Ear: Tympanic membrane, ear canal and external ear normal. No drainage or swelling.  No middle ear effusion. There is no impacted cerumen. No mastoid tenderness. Tympanic membrane is not injected, scarred, perforated  or erythematous.     Nose: Nose normal. No nasal tenderness, mucosal edema, congestion or rhinorrhea.     Right Turbinates: Not enlarged or swollen.     Left Turbinates: Not enlarged or swollen.     Right Sinus: No maxillary sinus tenderness or frontal sinus tenderness.     Left Sinus: No maxillary sinus tenderness or frontal sinus tenderness.     Mouth/Throat:     Lips: Pink.     Mouth: Mucous membranes are moist.     Pharynx: Oropharynx is clear. No  oropharyngeal exudate or posterior oropharyngeal erythema.  Eyes:     General: No scleral icterus.       Right eye: No discharge.        Left eye: No discharge.     Extraocular Movements: Extraocular movements intact.     Pupils: Pupils are equal, round, and reactive to light.  Neck:     Thyroid: No thyroid mass, thyromegaly or thyroid tenderness.     Trachea: Trachea normal. No tracheal tenderness or abnormal tracheal secretions.      Comments: NO supraclavicular or axillary lymphadenopathy Cardiovascular:     Rate and Rhythm: Normal rate and regular rhythm.     Pulses: Normal pulses.     Heart sounds: No murmur heard. Pulmonary:     Effort: Pulmonary effort is normal. No respiratory distress.     Breath sounds: Normal breath sounds. No stridor. No wheezing or rhonchi.  Musculoskeletal:     Cervical back: Full passive range of motion without pain, normal range of motion and neck supple. No rigidity, torticollis or tenderness. No pain with movement or muscular tenderness. Normal range of motion.     Right lower leg: No edema.     Left lower leg: No edema.  Skin:    General: Skin is warm and dry.     Coloration: Skin is not jaundiced.     Findings: No bruising, erythema or rash.  Neurological:     General: No focal deficit present.     Mental Status: She is alert and oriented to person, place, and time.     Sensory: No sensory deficit.     Motor: No weakness.  Psychiatric:        Mood and Affect: Mood normal.        Behavior: Behavior normal.      No results found for any visits on 11/18/23.  Last CBC Lab Results  Component Value Date   WBC 4.8 11/05/2023   HGB 13.3 11/05/2023   HCT 40.5 11/05/2023   MCV 92.7 11/05/2023   MCH 31.4 08/17/2018   RDW 12.9 11/05/2023   PLT 205.0 11/05/2023   Last metabolic panel Lab Results  Component Value Date   GLUCOSE 83 11/05/2023   NA 136 11/05/2023   K 4.1 11/05/2023   CL 101 11/05/2023   CO2 28 11/05/2023   BUN 13  11/05/2023   CREATININE 0.78 11/05/2023   EGFR 103 11/05/2023   CALCIUM 9.7 11/05/2023   PROT 7.7 11/05/2023   ALBUMIN 3.2 (L) 08/13/2018   BILITOT 0.4 11/05/2023   ALKPHOS 110 08/13/2018   AST 18 11/05/2023   ALT 29 11/05/2023   ANIONGAP 11 08/13/2018   Last lipids Lab Results  Component Value Date   CHOL 177 11/05/2023   HDL 32.70 (L) 11/05/2023   LDLCALC 111 (H) 11/05/2023   TRIG 163.0 (H) 11/05/2023   CHOLHDL 5 11/05/2023   Last hemoglobin A1c Lab Results  Component Value  Date   HGBA1C 5.4 11/05/2023   Last thyroid functions No results found for: "TSH", "T3TOTAL", "T4TOTAL", "THYROIDAB"    The ASCVD Risk score (Arnett DK, et al., 2019) failed to calculate for the following reasons:   The 2019 ASCVD risk score is only valid for ages 37 to 38  Assessment & Plan:  Lymphadenopathy of left cervical region Assessment & Plan: Appears to be resolving well. Only one small palpable lymph node remains, estimated to be 1.5-2cm. Suspect lymph nodes to have been reactive to previous infection. No continued infection noted. Pt will monitor for any regression of symptoms; will consider lymph node bx if recurrent or refractory.   Mixed hyperlipidemia Assessment & Plan: Noted on blood work obtained 11/14. Pt does not meet criteria for Rx treatment. Dietary modifications/ lifestyle changes only at present time.   Dermatitis of left ear canal Assessment & Plan: Resolved with use of previous ear drops      Return in about 6 months (around 05/17/2024) for Annual Physical.   Maretta Bees, PA

## 2023-11-18 NOTE — Patient Instructions (Addendum)
Your ultrasound showed three enlarged lymph nodes at time of the scan. Suspect these to have been reactive to your previous infection.  Please monitor the area closely, and if symptoms recur or do not completely resolve, will consider further assessment.  Continue to use your ear drops on a PRN basis, however the dermatitis today appears resolved.   Your cholesterol panel showed mildly elevated LDL cholesterol and Triglycerides. No medication is needed at this time, but cutting back on saturated fats and red meats will help. Increased cardiovascular activity would also be helpful.  Return for annual PE, or sooner as needed for lymph node reassessment.

## 2023-11-18 NOTE — Assessment & Plan Note (Signed)
Appears to be resolving well. Only one small palpable lymph node remains, estimated to be 1.5-2cm. Suspect lymph nodes to have been reactive to previous infection. No continued infection noted. Pt will monitor for any regression of symptoms; will consider lymph node bx if recurrent or refractory.

## 2023-11-18 NOTE — Assessment & Plan Note (Signed)
Noted on blood work obtained 11/14. Pt does not meet criteria for Rx treatment. Dietary modifications/ lifestyle changes only at present time.

## 2023-11-18 NOTE — Assessment & Plan Note (Signed)
Resolved with use of previous ear drops

## 2023-11-27 ENCOUNTER — Ambulatory Visit: Payer: BC Managed Care – PPO | Admitting: Urgent Care

## 2023-11-27 ENCOUNTER — Encounter: Payer: Self-pay | Admitting: Urgent Care

## 2023-11-27 ENCOUNTER — Telehealth: Payer: Self-pay

## 2023-11-27 VITALS — BP 117/80 | HR 103 | Temp 98.1°F | Wt 197.0 lb

## 2023-11-27 DIAGNOSIS — R519 Headache, unspecified: Secondary | ICD-10-CM

## 2023-11-27 DIAGNOSIS — J029 Acute pharyngitis, unspecified: Secondary | ICD-10-CM

## 2023-11-27 DIAGNOSIS — R430 Anosmia: Secondary | ICD-10-CM | POA: Diagnosis not present

## 2023-11-27 DIAGNOSIS — R590 Localized enlarged lymph nodes: Secondary | ICD-10-CM

## 2023-11-27 DIAGNOSIS — R5383 Other fatigue: Secondary | ICD-10-CM

## 2023-11-27 LAB — POCT RAPID STREP A (OFFICE): Rapid Strep A Screen: NEGATIVE

## 2023-11-27 LAB — POCT INFLUENZA A/B
Influenza A, POC: NEGATIVE
Influenza B, POC: NEGATIVE

## 2023-11-27 LAB — POC COVID19 BINAXNOW: SARS Coronavirus 2 Ag: NEGATIVE

## 2023-11-27 MED ORDER — LEVOFLOXACIN 500 MG PO TABS: 500.0000 mg | ORAL_TABLET | Freq: Every day | ORAL | 0 refills | Status: AC

## 2023-11-27 NOTE — Progress Notes (Signed)
Established Patient Office Visit  Subjective:  Patient ID: Becky Fuller, female    DOB: 1991-01-27  Age: 32 y.o. MRN: [id number]  Chief Complaint  Patient presents with   Sore Throat    She has been having sore throat and ear pain for about 3 days. She states both ears have been hurting and her lymph nodes are draining.     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 She reports a rapid worsening of symptoms, including earache, phlegm production, and nasal discharge of a lime green color. The patient also notes a sensation of symptoms 'draining' into the sinuses.  Prior to this episode, the patient had completed a course of azithromycin for a suspected infection and reported feeling back to normal. However, she now experiences fever with alternating chills and sweats, although the exact temperature was not measured. The patient denies any chest symptoms or body aches, but reports difficulty in getting warm and continued chills.  The patient has a history of not responding well to azithromycin and has had adverse reactions to certain antibiotics, specifically those beginning with a 'C' which have previously caused yeast infections.   The patient also notes that her lymph nodes are still slightly swollen, but denies any pain on palpation in the neck or chest area.  Sore Throat     Patient Active Problem List   Diagnosis Date Noted   Lymphadenopathy of left cervical region 11/18/2023   Mixed hyperlipidemia 11/18/2023   Mass of left side of neck 11/05/2023   Family history of thyroid disease in mother 11/05/2023   Dermatitis of left ear canal 11/05/2023   Postpartum care following cesarean delivery (8/24) 08/15/2018   1C/S - Indication: FTP 08/14/2018   Gestational hypertension 08/13/2018   Past Medical History:  Diagnosis Date   Abscess    Allergy    When i was a small child    Anemia    When inwas a teenager   Anxiety    Same as depression. Same appointment   Asthma    childhood   Depression 2015   Saidnotnwas due to stress from work.   Hypertension    During and after pregnancy   Mental disorder    Anxiety/depression was taking medications but stopped.    Mixed hyperlipidemia 11/18/2023   Social History   Tobacco Use   Smoking status: Never   Smokeless tobacco: Never  Substance Use Topics   Alcohol use: Not Currently    Comment: not in pregnancy   Drug use: Never      ROS: as noted in HPI  Objective:     BP 117/80   Pulse (!) 103   Temp 98.1 F (36.7 C) (Oral)   Wt 197 lb (89.4 kg)   SpO2 95%   BMI 33.81 kg/m  BP Readings from Last 3 Encounters:  11/27/23 117/80  11/18/23 119/85  11/05/23 114/80   Wt Readings from Last 3 Encounters:  11/27/23 197 lb (89.4 kg)  11/18/23 197 lb 3.2 oz (89.4 kg)  11/05/23 196 lb 12.8 oz (89.3 kg)      Physical Exam Vitals and nursing note reviewed.  Constitutional:      General: She is not in acute distress.    Appearance: She is ill-appearing. She is not toxic-appearing or diaphoretic.     Comments: Very warm to the touch  HENT:     Head:  Normocephalic and atraumatic.     Right Ear: Tympanic membrane, ear canal and external ear normal. There is no impacted cerumen.     Left Ear: Tympanic membrane, ear canal and external ear normal. There is no impacted cerumen.     Nose: Congestion and rhinorrhea present.     Mouth/Throat:     Mouth: Mucous membranes are moist.     Pharynx: Oropharynx is clear. Posterior oropharyngeal erythema present. No oropharyngeal exudate.     Tonsils: No tonsillar abscesses.  Eyes:     Extraocular Movements:     Right eye: Normal extraocular motion.     Left eye: Normal extraocular motion.     Conjunctiva/sclera: Conjunctivae normal.     Pupils: Pupils are equal, round, and reactive to light.  Neck:     Thyroid: No thyromegaly.  Cardiovascular:     Rate and  Rhythm: Normal rate and regular rhythm.  Pulmonary:     Effort: Pulmonary effort is normal. No respiratory distress.     Breath sounds: Normal breath sounds. No stridor. No wheezing, rhonchi or rales.  Chest:     Chest wall: No tenderness.  Musculoskeletal:     Cervical back: Normal range of motion and neck supple.  Lymphadenopathy:     Cervical: Cervical adenopathy (swollen lymph nodes still noted to L anterior and posterior cervical chain) present.  Skin:    General: Skin is warm and dry.     Coloration: Skin is not pale.     Findings: No erythema or rash.  Neurological:     General: No focal deficit present.     Mental Status: She is oriented to person, place, and time.      Results for orders placed or performed in visit on 11/27/23  POC COVID-19 BinaxNow  Result Value Ref Range   SARS Coronavirus 2 Ag Negative Negative  POCT Influenza A/B  Result Value Ref Range   Influenza A, POC Negative Negative   Influenza B, POC Negative Negative  POCT rapid strep A  Result Value Ref Range   Rapid Strep A Screen Negative Negative    Last CBC Lab Results  Component Value Date   WBC 4.8 11/05/2023   HGB 13.3 11/05/2023   HCT 40.5 11/05/2023   MCV 92.7 11/05/2023   MCH 31.4 08/17/2018   RDW 12.9 11/05/2023   PLT 205.0 11/05/2023   Last metabolic panel Lab Results  Component Value Date   GLUCOSE 83 11/05/2023   NA 136 11/05/2023   K 4.1 11/05/2023   CL 101 11/05/2023   CO2 28 11/05/2023   BUN 13 11/05/2023   CREATININE 0.78 11/05/2023   EGFR 103 11/05/2023   CALCIUM 9.7 11/05/2023   PROT 7.7 11/05/2023   ALBUMIN 3.2 (L) 08/13/2018   BILITOT 0.4 11/05/2023   ALKPHOS 110 08/13/2018   AST 18 11/05/2023   ALT 29 11/05/2023   ANIONGAP 11 08/13/2018      The ASCVD Risk score (Arnett DK, et al., 2019) failed to calculate for the following reasons:   The 2019 ASCVD risk score is only valid for ages 16 to 11  Assessment & Plan:  Lymphadenopathy of left cervical  region -     levoFLOXacin; Take 1 tablet (500 mg total) by mouth daily for 10 days.  Dispense: 10 tablet; Refill: 0 -     CT SOFT TISSUE NECK W CONTRAST; Future -     Mononucleosis screen  Sinus headache -     levoFLOXacin; Take 1  tablet (500 mg total) by mouth daily for 10 days.  Dispense: 10 tablet; Refill: 0 -     CT MAXILLOFACIAL W CONTRAST; Future -     POC COVID-19 BinaxNow -     POCT Influenza A/B  Anosmia -     CT MAXILLOFACIAL W CONTRAST; Future  Sore throat -     levoFLOXacin; Take 1 tablet (500 mg total) by mouth daily for 10 days.  Dispense: 10 tablet; Refill: 0 -     POCT rapid strep A -     Mononucleosis screen  Other fatigue -     Mononucleosis screen  Assessment & Plan Acute onset of severe sore throat, ear pain, and green nasal discharge. Recent exposure to children with infectious illnesses. No chest symptoms. Lymph nodes still swollen. No improvement with recent azithromycin treatment. -strep, flu, and COVID-19 in office testing all negative. -will do levaquin 500mg  daily x 10 days to cover for suspect sinus cause of recurrent symptoms. BBW information provided to patient. -Mono screening due to lymphadenopathy, sore throat and fatigue. -recent CBC was normal, will not recheck today -CT maxillofacial and soft tissue neck scheduled in future only if no response to current treatment, or if symptoms continue to recur.  No follow-ups on file.   Maretta Bees, PA

## 2023-11-27 NOTE — Patient Instructions (Addendum)
Your covid, flu and strep tests are negative.  We are testing you for mono.  Please start taking levaquin one tablet every 24 hours. Drink LOTS of water while on this antibiotic. Please read all BBW label instructions - If you develop severe ankle pain, stop the medication and notify our office.  If your symptoms persist, please obtain the CT maxillary and neck as discussed.

## 2023-11-27 NOTE — Telephone Encounter (Signed)
Patient seen today. Has questions about med prescribed.

## 2023-11-27 NOTE — Telephone Encounter (Signed)
Called patient back and answered all questions. Pharmacy annotated on script bottle she needs to take med in AM; she wanted to clarifiy that she could start it now. Encouraged pt to start now, but then not take her next dose until 24 hours from now. All questions/ concerns addressed.

## 2023-11-28 LAB — MONONUCLEOSIS SCREEN: Heterophile, Mono Screen: NEGATIVE

## 2023-11-29 ENCOUNTER — Encounter: Payer: Self-pay | Admitting: Urgent Care

## 2023-12-04 ENCOUNTER — Ambulatory Visit: Payer: BC Managed Care – PPO

## 2023-12-04 DIAGNOSIS — R590 Localized enlarged lymph nodes: Secondary | ICD-10-CM | POA: Diagnosis not present

## 2023-12-04 DIAGNOSIS — R519 Headache, unspecified: Secondary | ICD-10-CM | POA: Diagnosis not present

## 2023-12-04 DIAGNOSIS — R430 Anosmia: Secondary | ICD-10-CM | POA: Diagnosis not present

## 2023-12-04 MED ORDER — IOHEXOL 300 MG/ML  SOLN
200.0000 mL | Freq: Once | INTRAMUSCULAR | Status: AC | PRN
  Administered 2023-12-04: 150 mL via INTRAVENOUS

## 2024-04-29 ENCOUNTER — Ambulatory Visit (INDEPENDENT_AMBULATORY_CARE_PROVIDER_SITE_OTHER): Admitting: Family Medicine

## 2024-04-29 ENCOUNTER — Encounter: Payer: Self-pay | Admitting: Family Medicine

## 2024-04-29 VITALS — BP 118/82 | HR 75 | Temp 98.6°F | Ht 64.0 in | Wt 207.4 lb

## 2024-04-29 DIAGNOSIS — R519 Headache, unspecified: Secondary | ICD-10-CM

## 2024-04-29 DIAGNOSIS — J019 Acute sinusitis, unspecified: Secondary | ICD-10-CM

## 2024-04-29 DIAGNOSIS — J209 Acute bronchitis, unspecified: Secondary | ICD-10-CM

## 2024-04-29 DIAGNOSIS — J9801 Acute bronchospasm: Secondary | ICD-10-CM

## 2024-04-29 MED ORDER — ALBUTEROL SULFATE HFA 108 (90 BASE) MCG/ACT IN AERS: 2.0000 | INHALATION_SPRAY | Freq: Four times a day (QID) | RESPIRATORY_TRACT | 0 refills | Status: AC | PRN

## 2024-04-29 MED ORDER — METHYLPREDNISOLONE ACETATE 80 MG/ML IJ SUSP
80.0000 mg | Freq: Once | INTRAMUSCULAR | Status: AC
  Administered 2024-04-29: 80 mg via INTRAMUSCULAR

## 2024-04-29 MED ORDER — AMOXICILLIN 875 MG PO TABS: 875.0000 mg | ORAL_TABLET | Freq: Two times a day (BID) | ORAL | 0 refills | Status: AC

## 2024-04-29 NOTE — Progress Notes (Signed)
 OFFICE VISIT  04/29/2024  CC:  Chief Complaint  Patient presents with   Sinus Congestion    X 1 week; ear pain, primarily left ear but constant blowing of nose causes right ear pain. Tried taking Ibuprofen . About 3rd night, broke out in cold sweats, denies fever    Patient is a 33 y.o. female who presents for ear pain and sinus pressure.  HPI: Becky Fuller has had 7 days of nasal congestion/runny nose, thick postnasal drip, sore throat on the left side, and persistent left ear pain. A few nights ago she had cold sweats in the night. Last couple of days has had a deep cough and some chest tightness.  Past Medical History:  Diagnosis Date   Abscess    Allergy    When i was a small child   Anemia    When inwas a teenager   Anxiety    Same as depression. Same appointment   Asthma    childhood   Depression 2015   Saidnotnwas due to stress from work.   Hypertension    During and after pregnancy   Mental disorder    Anxiety/depression was taking medications but stopped.    Mixed hyperlipidemia 11/18/2023    Past Surgical History:  Procedure Laterality Date   CESAREAN SECTION N/A 08/14/2018   Procedure: CESAREAN SECTION;  Surgeon: Terri Fester, MD;  Location: [facility];  Service: Obstetrics;  Laterality: N/A;   CYST EXCISION      Outpatient Medications Prior to Visit  Medication Sig Dispense Refill   ibuprofen  (ADVIL ) 800 MG tablet Take 1 tablet (800 mg total) by mouth 3 (three) times daily. 21 tablet 0   cetirizine  (ZYRTEC  ALLERGY) 10 MG tablet Take 1 tablet (10 mg total) by mouth daily. (Patient not taking: Reported on 04/29/2024) 30 tablet 0   Prenatal Vit-Fe Fumarate-FA (PRENATAL MULTIVITAMIN) TABS tablet Take 1 tablet by mouth daily at 12 noon. (Patient not taking: Reported on 11/27/2023)     No facility-administered medications prior to visit.    Allergies  Allergen Reactions   Tylenol  [Acetaminophen ] Anaphylaxis    Review of Systems  As per HPI  PE:     04/29/2024    4:02 PM 11/27/2023    1:42 PM 11/18/2023   11:20 AM  Vitals with BMI  Height 5\' 4"     Weight 207 lbs 6 oz 197 lbs 197 lbs 3 oz  BMI 35.58 33.8 33.83  Systolic 118 117 409  Diastolic 82 80 85  Pulse 75 103 88     Physical Exam  VS: noted--normal. Gen: alert, NAD, NONTOXIC APPEARING. HEENT: eyes without injection, drainage, or swelling.  Ears: EACs clear, TMs with normal light reflex and landmarks.  Nose: Clear rhinorrhea, with some dried, crusty exudate adherent to mildly injected mucosa.  No purulent d/c.  No paranasal sinus TTP.  No facial swelling.  Throat and mouth without focal lesion.  No pharyngial swelling, erythema, or exudate.   Neck: supple, no LAD.   LUNGS: CTA bilat with normal respirations.  When asked to take a deep breath and blow out forcefully she did have a fair amount of rhonchorous breath sounds and had some deep coughing. Nonlabored resps.   CV: RRR, no m/r/g. EXT: no c/c/e SKIN: no rash  LABS:  None  IMPRESSION AND PLAN:  URI with bronchitis. Her nasal mucus is purulent and persistent.  She has some bronchospasm. Will treat with amoxicillin  875 mg twice a day x 10 days. Albuterol  inhaler 2  puffs 4 times daily as needed. Depo-Medrol 80 mg IM here today.  An After Visit Summary was printed and given to the patient.  FOLLOW UP: Return if symptoms worsen or fail to improve.  Signed:  Arletha Lady, MD           04/29/2024

## 2024-08-12 ENCOUNTER — Ambulatory Visit: Admitting: Family Medicine

## 2024-08-12 ENCOUNTER — Telehealth: Payer: Self-pay

## 2024-08-12 ENCOUNTER — Encounter: Payer: Self-pay | Admitting: Family Medicine

## 2024-08-12 VITALS — BP 116/81 | HR 69 | Temp 98.5°F | Wt 207.6 lb

## 2024-08-12 DIAGNOSIS — R21 Rash and other nonspecific skin eruption: Secondary | ICD-10-CM | POA: Diagnosis not present

## 2024-08-12 DIAGNOSIS — L309 Dermatitis, unspecified: Secondary | ICD-10-CM

## 2024-08-12 MED ORDER — METHYLPREDNISOLONE ACETATE 80 MG/ML IJ SUSP
80.0000 mg | Freq: Once | INTRAMUSCULAR | Status: AC
  Administered 2024-08-12: 80 mg via INTRAMUSCULAR

## 2024-08-12 MED ORDER — PREDNISONE 20 MG PO TABS: ORAL_TABLET | ORAL | 0 refills | Status: AC

## 2024-08-12 MED ORDER — TRIAMCINOLONE ACETONIDE 0.1 % EX CREA: 1.0000 | TOPICAL_CREAM | Freq: Two times a day (BID) | CUTANEOUS | 1 refills | Status: AC

## 2024-08-12 NOTE — Addendum Note (Signed)
 Addended by: GEORGEAN BEEN A on: 08/12/2024 02:32 PM   Modules accepted: Orders

## 2024-08-12 NOTE — Telephone Encounter (Signed)
 LVM for pt to call back. Pt needs to see previous PCP or reestablish with one of our providers here.

## 2024-08-12 NOTE — Patient Instructions (Addendum)
 Return in about 3 weeks (around 09/02/2024) for f/u rash and est care with new provider.        Great to see you today.  I have refilled the medication(s) we provide.   If labs were collected or images ordered, we will inform you of  results once we have received them and reviewed. We will contact you either by echart message, or telephone call.  Please give ample time to the testing facility, and our office to run,  receive and review results. Please do not call inquiring of results, even if you can see them in your chart. We will contact you as soon as we are able. If it has been over 1 week since the test was completed, and you have not yet heard from us , then please call us .    - echart message- for normal results that have been seen by the patient already.   - telephone call: abnormal results or if patient has not viewed results in their echart.  If a referral to a specialist was entered for you, please call us  in 2 weeks if you have not heard from the specialist office to schedule.

## 2024-08-12 NOTE — Progress Notes (Addendum)
 Becky Fuller , 1991/10/15, 33 y.o., female MRN: [id number] Patient Care Team    Relationship Specialty Notifications Start End  Lowella Benton [place], [place] PCP - General Physician Assistant  08/12/24     Chief Complaint  Patient presents with   Rash    Eye lids, Face and neck; onset friday     Subjective: Becky Fuller is a 33 y.o. Pt presents for an OV with complaints of rash of 1 week duration.  Associated symptoms include flaky dry patches on top of right base on top of eyes, around chin and around her neck.  She reports she has had psoriasis outbreaks on the back of her head/neck about 2 times in the last 5 years. She has never had a rash in the current location, except for the eyelids she has at that rash occur at times. She denies any changes in personal care products or household cleaning products.  She reports she has any more make-up majority of the summer.  She has tried hydrocortisone cream.  She has never been evaluated by dermatology.     11/05/2023    3:13 PM  Depression screen PHQ 2/9  Decreased Interest 3  Down, Depressed, Hopeless 1  PHQ - 2 Score 4  Altered sleeping 0  Tired, decreased energy 1  Change in appetite 2  Feeling bad or failure about yourself  1  Trouble concentrating 0  Moving slowly or fidgety/restless 0  Suicidal thoughts 0  PHQ-9 Score 8  Difficult doing work/chores Somewhat difficult    Allergies  Allergen Reactions   Tylenol  [Acetaminophen ] Anaphylaxis   Social History   Social History Narrative   Not on file   Past Medical History:  Diagnosis Date   Abscess    Allergy    When i was a small child   Anemia    When inwas a teenager   Anxiety    Same as depression. Same appointment   Asthma    childhood   Depression 2015   Saidnotnwas due to stress from work.   Hypertension    During and after pregnancy   Mental disorder    Anxiety/depression was taking medications but stopped.    Mixed hyperlipidemia 11/18/2023    Past Surgical History:  Procedure Laterality Date   CESAREAN SECTION N/A 08/14/2018   Procedure: CESAREAN SECTION;  Surgeon: Barbette Knock, MD;  Location: [facility];  Service: Obstetrics;  Laterality: N/A;   CYST EXCISION     Family History  Problem Relation Age of Onset   Healthy Mother    Arthritis Mother    COPD Mother    Healthy Father    ADD / ADHD Father    COPD Father    Stroke Maternal Grandfather    Arthritis Maternal Grandmother    Hypertension Maternal Grandmother    Allergies as of 08/12/2024       Reactions   Tylenol  [acetaminophen ] Anaphylaxis        Medication List        Accurate as of August 12, 2024  2:52 PM. If you have any questions, ask your nurse or doctor.          albuterol  108 (90 Base) MCG/ACT inhaler Commonly known as: VENTOLIN  HFA Inhale 2 puffs into the lungs every 6 (six) hours as needed for wheezing or shortness of breath.   ibuprofen  800 MG tablet Commonly known as: ADVIL  Take 1 tablet (800 mg total) by mouth 3 (  three) times daily.   predniSONE  20 MG tablet Commonly known as: DELTASONE  40 mg x3d, 20 mg x2d, 10 mg x2d Start taking on: August 13, 2024 Started by: Charlies Bellini   triamcinolone  cream 0.1 % Commonly known as: KENALOG  Apply 1 Application topically 2 (two) times daily. Started by: Charlies Bellini        All past medical history, surgical history, allergies, family history, immunizations andmedications were updated in the EMR today and reviewed under the history and medication portions of their EMR.     ROS Negative, with the exception of above mentioned in HPI   Objective:  BP 116/81   Pulse 69   Temp 98.5 F (36.9 C)   Wt 207 lb 9.6 oz (94.2 kg)   LMP 08/05/2024   SpO2 96%   BMI 35.63 kg/m  Body mass index is 35.63 kg/m. Physical Exam Vitals and nursing note reviewed.  Constitutional:      General: She is not in acute distress.    Appearance: Normal appearance. She is normal weight.  She is not ill-appearing or toxic-appearing.  HENT:     Head: Normocephalic and atraumatic.      Comments: Scaly plaque like rash with erythemic base present in multiple locations head and neck.  Mild presentation bilateral upper eyelids and bilateral sides of chin.  Mild to moderate presentation bilateral anterior neck. Eyes:     General: No scleral icterus.       Right eye: No discharge.        Left eye: No discharge.     Extraocular Movements: Extraocular movements intact.     Conjunctiva/sclera: Conjunctivae normal.     Pupils: Pupils are equal, round, and reactive to light.  Skin:    Findings: Rash present.  Neurological:     Mental Status: She is alert and oriented to person, place, and time. Mental status is at baseline.     Motor: No weakness.     Coordination: Coordination normal.     Gait: Gait normal.  Psychiatric:        Mood and Affect: Mood normal.        Behavior: Behavior normal.        Thought Content: Thought content normal.        Judgment: Judgment normal.      No results found. No results found. No results found for this or any previous visit (from the past 24 hours).  Assessment/Plan: Becky Fuller is a 33 y.o. female present for OV for  Eczema versus psoriasis Start triamcinolone  cream twice daily, patient understands to not use longer than 14 days and only as needed. IM Depo-Medrol  80 mg injection provided today followed by a short course of prednisone  taper. Encouraged her to start Xyzal nightly Therapy ointment after showers encouraged.  Pat dry only. Consider dermatological referral if symptoms are not improving on follow-up.  Reviewed expectations re: course of current medical issues. Discussed self-management of symptoms. Outlined signs and symptoms indicating need for more acute intervention. Patient verbalized understanding and all questions were answered. Patient received an After-Visit Summary.  Return in about 3 weeks (around  09/02/2024) for f/u rash and est care with new provider.   No orders of the defined types were placed in this encounter.  Meds ordered this encounter  Medications   predniSONE  (DELTASONE ) 20 MG tablet    Sig: 40 mg x3d, 20 mg x2d, 10 mg x2d    Dispense:  9 tablet    Refill:  0   triamcinolone  cream (KENALOG ) 0.1 %    Sig: Apply 1 Application topically 2 (two) times daily.    Dispense:  30 g    Refill:  1   methylPREDNISolone  acetate (DEPO-MEDROL ) injection 80 mg   Referral Orders  No referral(s) requested today     Note is dictated utilizing voice recognition software. Although note has been proof read prior to signing, occasional typographical errors still can be missed. If any questions arise, please do not hesitate to call for verification.   electronically signed by:  Charlies Bellini, DO  [facility] - [place]

## 2024-08-12 NOTE — Addendum Note (Signed)
 Addended by: Najai Waszak A on: 08/12/2024 02:52 PM   Modules accepted: Orders, Level of Service

## 2024-09-05 ENCOUNTER — Encounter: Admitting: Family Medicine

## 2024-09-15 ENCOUNTER — Ambulatory Visit (INDEPENDENT_AMBULATORY_CARE_PROVIDER_SITE_OTHER): Admitting: Family Medicine

## 2024-09-15 DIAGNOSIS — Z91199 Patient's noncompliance with other medical treatment and regimen due to unspecified reason: Secondary | ICD-10-CM

## 2024-09-15 NOTE — Progress Notes (Signed)
     No-show for transfer care appointment.  Patient is not to be rescheduled on this provider schedule. Benton Gave, P.A> remains her PCP- located at [facility] care in [place].
